# Patient Record
Sex: Female | Born: 1945 | Race: White | Hispanic: No | Marital: Married | Smoking: Current every day smoker
Health system: Southern US, Community
[De-identification: ages and names within clinical notes are randomized; demographics above are authoritative.]

## PROBLEM LIST (undated history)

## (undated) DIAGNOSIS — C801 Malignant (primary) neoplasm, unspecified: Secondary | ICD-10-CM

## (undated) DIAGNOSIS — H3530 Unspecified macular degeneration: Secondary | ICD-10-CM

## (undated) DIAGNOSIS — K219 Gastro-esophageal reflux disease without esophagitis: Secondary | ICD-10-CM

## (undated) DIAGNOSIS — E785 Hyperlipidemia, unspecified: Secondary | ICD-10-CM

## (undated) DIAGNOSIS — T7840XA Allergy, unspecified, initial encounter: Secondary | ICD-10-CM

## (undated) DIAGNOSIS — N8110 Cystocele, unspecified: Secondary | ICD-10-CM

## (undated) DIAGNOSIS — H269 Unspecified cataract: Secondary | ICD-10-CM

## (undated) DIAGNOSIS — K635 Polyp of colon: Secondary | ICD-10-CM

## (undated) DIAGNOSIS — E079 Disorder of thyroid, unspecified: Secondary | ICD-10-CM

## (undated) DIAGNOSIS — K5790 Diverticulosis of intestine, part unspecified, without perforation or abscess without bleeding: Secondary | ICD-10-CM

## (undated) HISTORY — DX: Unspecified macular degeneration: H35.30

## (undated) HISTORY — DX: Unspecified cataract: H26.9

## (undated) HISTORY — DX: Polyp of colon: K63.5

## (undated) HISTORY — PX: OOPHORECTOMY: SHX86

## (undated) HISTORY — PX: POLYPECTOMY: SHX149

## (undated) HISTORY — DX: Disorder of thyroid, unspecified: E07.9

## (undated) HISTORY — DX: Allergy, unspecified, initial encounter: T78.40XA

## (undated) HISTORY — DX: Diverticulosis of intestine, part unspecified, without perforation or abscess without bleeding: K57.90

## (undated) HISTORY — DX: Malignant (primary) neoplasm, unspecified: C80.1

## (undated) HISTORY — PX: COLONOSCOPY: SHX174

## (undated) HISTORY — DX: Cystocele, unspecified: N81.10

## (undated) HISTORY — PX: TUBAL LIGATION: SHX77

## (undated) HISTORY — PX: MOUTH SURGERY: SHX715

## (undated) HISTORY — DX: Hyperlipidemia, unspecified: E78.5

## (undated) HISTORY — DX: Gastro-esophageal reflux disease without esophagitis: K21.9

---

## 1970-06-30 HISTORY — PX: TONSILLECTOMY: SUR1361

## 1975-07-01 HISTORY — PX: FOOT SURGERY: SHX648

## 1991-07-01 HISTORY — PX: ABDOMINAL HYSTERECTOMY: SHX81

## 1998-12-20 ENCOUNTER — Other Ambulatory Visit: Admission: RE | Admit: 1998-12-20 | Discharge: 1998-12-20 | Payer: Self-pay | Admitting: Family Medicine

## 2000-10-19 ENCOUNTER — Emergency Department (HOSPITAL_COMMUNITY): Admission: EM | Admit: 2000-10-19 | Discharge: 2000-10-19 | Payer: Self-pay

## 2001-07-13 ENCOUNTER — Other Ambulatory Visit: Admission: RE | Admit: 2001-07-13 | Discharge: 2001-07-13 | Payer: Self-pay | Admitting: Family Medicine

## 2005-04-22 ENCOUNTER — Other Ambulatory Visit: Admission: RE | Admit: 2005-04-22 | Discharge: 2005-04-22 | Payer: Self-pay | Admitting: Family Medicine

## 2007-08-04 ENCOUNTER — Ambulatory Visit: Payer: Self-pay | Admitting: Internal Medicine

## 2007-08-17 ENCOUNTER — Encounter: Payer: Self-pay | Admitting: Internal Medicine

## 2007-08-17 ENCOUNTER — Ambulatory Visit: Payer: Self-pay | Admitting: Internal Medicine

## 2007-08-17 LAB — CONVERTED CEMR LAB
BUN: 7 mg/dL (ref 6–23)
Creatinine, Ser: 0.6 mg/dL (ref 0.4–1.2)

## 2007-08-19 ENCOUNTER — Ambulatory Visit: Payer: Self-pay | Admitting: Cardiology

## 2007-09-17 ENCOUNTER — Ambulatory Visit: Payer: Self-pay | Admitting: Internal Medicine

## 2008-09-06 ENCOUNTER — Ambulatory Visit (HOSPITAL_COMMUNITY): Admission: RE | Admit: 2008-09-06 | Discharge: 2008-09-06 | Payer: Self-pay | Admitting: Family Medicine

## 2010-06-18 ENCOUNTER — Ambulatory Visit (HOSPITAL_COMMUNITY)
Admission: RE | Admit: 2010-06-18 | Discharge: 2010-06-18 | Payer: Self-pay | Source: Home / Self Care | Attending: Family Medicine | Admitting: Family Medicine

## 2010-11-12 NOTE — Assessment & Plan Note (Signed)
[facility]                         GASTROENTEROLOGY OFFICE NOTE   Rhonda Kelly                          MRN:          [id number]  DATE:09/17/2007                            DOB:          [birth year]    HISTORY:  Ms. Rhonda Kelly presents today for followup.  She is a 65 year old  who was evaluated on August 04, 2007 for change in bowel habits,  abdominal pain, and colonoscopy.  See that dictation for details.  Complete colonoscopy was performed on August 17, 2007.  She was found  to have multiple diminutive adenomatous colon polyps which were removed.  She was also noted to have left-sided diverticulosis.  Follow-up  colonoscopy in three years recommended.  She subsequently underwent CT  scan of the abdomen and pelvis.  There were no acute abnormalities.  She  was said to have an indeterminate 11 mm left adrenal nodule.  The  radiologist recommended follow-up CT scan in 12 months or an MRI.  The  patient presents today for followup.  Since her colonoscopy, she reports  that she has had absolutely no problems with pain or any complaints.  She feels that the reassurance from the exam was most helpful.  I  reviewed with her today in detail her colonoscopy as well as the CT scan  results.   Her current medications remain simvastatin, Zetia, fenofibrate, Nexium,  and baby aspirin.   PHYSICAL EXAMINATION:  Physical exam today finds a well-appearing in no  acute distress.  She is alert and oriented.  Blood pressure 110/76, heart rate 72, weight is 129.4 pounds.  ABDOMEN:  Soft without tenderness, mass, or hernia.   IMPRESSION:  1. Complaints of left lower quadrant discomfort and change of bowel      habits, now resolved.  She may have had some transient spasm      related to diverticular disease or possibly adhesions from prior      hysterectomy.  In any event, she is doing well.  2. Multiple adenomatous colon polyps.  Due for surveillance  colonoscopy in three years.  3. Diverticulosis.  4. Family history of colon cancer.  5. Incidental 11 mm left adrenal nodule.  I told the patient that it      probably would be best for her to have followup in 12 months, as      recommended by the radiologist.  She can discuss with her primary      care providers, Dr. Christell Constant and Birdena Jubilee.  They can arrange the      follow-up examination at the appropriate time.  Otherwise, she will      follow up with me in this office for her routine colonoscopy in      three years.  Of course, she can return sooner if needed for any      active GI issues.     [name]. Marina Goodell, MD  Electronically Signed   JNP/MedQ  DD: 09/17/2007  DT: 09/17/2007  Job #: [id number]   cc:   Ernestina Penna, M.D.  Birdena Jubilee, NP

## 2010-11-12 NOTE — Assessment & Plan Note (Signed)
[facility]                         GASTROENTEROLOGY OFFICE NOTE   SHLEY, DOLBY                          MRN:          [id number]  DATE:08/04/2007                            DOB:          [birth year]    REASON FOR CONSULTATION:  Change in bowel habits, abdominal pain, and  colonoscopy.   HISTORY:  This is a pleasant, 65 year old white female with a history of  dyslipidemia and reflux disease.  She is referred through the courtesy  of Paulita Cradle, nurse practitioner, and Dr. Rudi Heap regarding  the above-listed issues.  The patient reports to me a 6 to 8 week  history of a constant, nagging discomfort in the left lower quadrant.  She describes it as a knot.  The pain or discomfort is not severe  enough to inhibit her activities.  Symptoms have been more prominent in  the last few weeks.  She is able to sleep.  Symptoms are sometimes worse  a couple of hours after a big meal.  She has also noticed that her  stools have changed in caliber.  She describes them as flat or pencil  sized.  She denies nausea, vomiting, or rectal bleeding.  Recent  hemoglobin was normal at 14.4.  She also mentions that her mother at age  60 was diagnosed with colon cancer last spring.  Her discomfort does not  radiate.  She denies urinary changes.  Her only other complaint is  occasional gas.  She does take Nexium infrequently (every 3 or 4 days),  or as needed for indigestion.  This is particularly noticeable with  dietary indiscretion, no dysphagia.   PAST MEDICAL HISTORY:  1. Dyslipidemia.  2. Reflux disease.   PAST SURGICAL HISTORY:  1. Hysterectomy with bilateral salpingo-oophorectomy.  2. Tubal ligation.  3. Tonsillectomy.  4. Unspecified foot surgery remotely.   ALLERGIES:  No known drug allergies.   CURRENT MEDICATIONS:  1. Simvastatin 40 mg daily.  2. Zetia 10 mg daily.  3. Fenofibrate 160 mg daily.  4. Nexium 40 mg daily.   FAMILY HISTORY:   Mother and 2 maternal aunts with colon cancer in their  advanced years.  Father with prostate cancer.   SOCIAL HISTORY:  The patient is married with 1 daughter.  She lives with  her husband.  She attended college for 2 years.  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  She  smokes a half a pack of cigarettes per day and has done so for years.  She denies alcohol use.   REVIEW OF SYSTEMS:  Per diagnostic evaluation form.   PHYSICAL EXAMINATION:  A well-appearing female in no acute distress.  Her blood pressure is 128/72, her heart rate is 68, her weight is 128  pounds.  She is 5 feet 2 inches in height.  HEENT:  Sclerae are anicteric, conjunctivae are pink, oral mucosa is  intact, there is no adenopathy.  LUNGS:  Clear.  HEART:  Regular.  ABDOMEN:  Soft without tenderness, masses, or hernia.  EXTREMITIES:  Without edema.   IMPRESSION:  1. Vague, focal left lower quadrant discomfort of uncertain etiology.  2. Change in stool caliber of uncertain clinical significance.  3. Family history of colon cancer in 1st and multiple 2nd degree      relatives.   RECOMMENDATIONS:  1. Colonoscopy to evaluate change in bowel habits, lower abdominal      discomfort, and provide neoplasia screening.  The nature of the      procedure as well as the risks, benefits, and alternatives have      been reviewed.  She understood and agreed to proceed.  2. If colonoscopy unrevealing, then consider imaging such as CT to      further evaluate discomfort.  3. Ongoing general medical care with Dr. Christell Constant.     [name]. Marina Goodell, MD  Electronically Signed    JNP/MedQ  DD: 08/04/2007  DT: 08/05/2007  Job #: [id number]   cc:   Ernestina Penna, M.D.

## 2011-10-27 ENCOUNTER — Encounter: Payer: Self-pay | Admitting: Internal Medicine

## 2011-11-27 ENCOUNTER — Encounter: Payer: Self-pay | Admitting: Internal Medicine

## 2011-11-27 ENCOUNTER — Ambulatory Visit (AMBULATORY_SURGERY_CENTER): Payer: Federal, State, Local not specified - PPO | Admitting: *Deleted

## 2011-11-27 VITALS — Ht 61.5 in | Wt 126.1 lb

## 2011-11-27 DIAGNOSIS — Z1211 Encounter for screening for malignant neoplasm of colon: Secondary | ICD-10-CM

## 2011-11-27 MED ORDER — PEG-KCL-NACL-NASULF-NA ASC-C 100 G PO SOLR: ORAL | Status: DC

## 2011-12-10 ENCOUNTER — Ambulatory Visit (AMBULATORY_SURGERY_CENTER): Payer: Federal, State, Local not specified - PPO | Admitting: Internal Medicine

## 2011-12-10 ENCOUNTER — Encounter: Payer: Self-pay | Admitting: Internal Medicine

## 2011-12-10 VITALS — BP 135/75 | HR 65 | Temp 98.2°F | Resp 17 | Ht 61.0 in | Wt 126.0 lb

## 2011-12-10 DIAGNOSIS — Z860100 Personal history of colon polyps, unspecified: Secondary | ICD-10-CM

## 2011-12-10 DIAGNOSIS — D126 Benign neoplasm of colon, unspecified: Secondary | ICD-10-CM

## 2011-12-10 DIAGNOSIS — Z1211 Encounter for screening for malignant neoplasm of colon: Secondary | ICD-10-CM

## 2011-12-10 MED ORDER — SODIUM CHLORIDE 0.9 % IV SOLN: 500.0000 mL | INTRAVENOUS | Status: DC

## 2011-12-10 NOTE — Op Note (Signed)
[facility] [address] [place], [place]  [address]  COLONOSCOPY PROCEDURE REPORT  PATIENT:  Rhonda, Kelly  MR#:  [id number] BIRTHDATE:  [birth year], 65 yrs. old  GENDER:  female ENDOSCOPIST:  December Hedtke. Eda Keys, MD REF. BY:  Surveillance Program Recall, PROCEDURE DATE:  12/10/2011 PROCEDURE:  Colonoscopy with snare polypectomy x 3 ASA CLASS:  Class II INDICATIONS:  history of pre-cancerous (adenomatous) colon polyps, family history of colon cancer, surveillance and high-risk screening ; index 08-2007 w/ multiple adenomas; parent (83) and 2 aunts MEDICATIONS:   MAC sedation, administered by CRNA, propofol (Diprivan) 300 mg IV  DESCRIPTION OF PROCEDURE:   After the risks benefits and alternatives of the procedure were thoroughly explained, informed consent was obtained.  Digital rectal exam was performed and revealed no abnormalities.   The LB CF-[id number] endoscope was introduced through the anus and advanced to the cecum, which was identified by both the appendix and ileocecal valve, without limitations.  The quality of the prep was excellent, using MoviPrep.  The instrument was then slowly withdrawn as the colon was fully examined. <<PROCEDUREIMAGES>>  FINDINGS:  Three polyps were found - 6mm in the cecum and 2mm, 7mm in the transverse colon. Polyps were snared without cautery. Retrieval was successful in larger two.  Moderate diverticulosis was found in the left colon.  Otherwise normal colonoscopy without other polyps, masses, vascular ectasias, or inflammatory changes. Retroflexed views in the rectum revealed no abnormalities.    The time to cecum =  3:21  minutes. The scope was then withdrawn in 11:21  minutes from the cecum and the procedure completed.  COMPLICATIONS:  None  ENDOSCOPIC IMPRESSION: 1) Three polyps - removed 2) Moderate diverticulosis in the left colon 3) Otherwise normal colonoscopy  RECOMMENDATIONS: 1) Repeat Colonoscopy in 3  years.  ______________________________ [name]. Eda Keys, MD  CC:  Paulita Cradle, NP;  The Patient  n. eSIGNED:   Merel Santoli N. Eda Keys at 12/10/2011 11:49 AM  Estill Bakes, [phone]

## 2011-12-10 NOTE — Patient Instructions (Addendum)
YOU HAD AN ENDOSCOPIC PROCEDURE TODAY AT THE [facility]: Refer to the procedure report that was given to you for any specific questions about what was found during the examination.  If the procedure report does not answer your questions, please call your gastroenterologist to clarify.  If you requested that your care partner not be given the details of your procedure findings, then the procedure report has been included in a sealed envelope for you to review at your convenience later.  YOU SHOULD EXPECT: Some feelings of bloating in the abdomen. Passage of more gas than usual.  Walking can help get rid of the air that was put into your GI tract during the procedure and reduce the bloating. If you had a lower endoscopy (such as a colonoscopy or flexible sigmoidoscopy) you may notice spotting of blood in your stool or on the toilet paper. If you underwent a bowel prep for your procedure, then you may not have a normal bowel movement for a few days.  DIET: Your first meal following the procedure should be a light meal and then it is ok to progress to your normal diet.  A half-sandwich or bowl of soup is an example of a good first meal.  Heavy or fried foods are harder to digest and may make you feel nauseous or bloated.  Likewise meals heavy in dairy and vegetables can cause extra gas to form and this can also increase the bloating.  Drink plenty of fluids but you should avoid alcoholic beverages for 24 hours.  ACTIVITY: Your care partner should take you home directly after the procedure.  You should plan to take it easy, moving slowly for the rest of the day.  You can resume normal activity the day after the procedure however you should NOT DRIVE or use heavy machinery for 24 hours (because of the sedation medicines used during the test).    SYMPTOMS TO REPORT IMMEDIATELY: A gastroenterologist can be reached at any hour.  During normal business hours, 8:30 AM to 5:00 PM Monday through Friday,  call [phone].  After hours and on weekends, please call the GI answering service at [phone] who will take a message and have the physician on call contact you.   Following lower endoscopy (colonoscopy or flexible sigmoidoscopy):  Excessive amounts of blood in the stool  Significant tenderness or worsening of abdominal pains  Swelling of the abdomen that is new, acute  Fever of 100F or higher  Following upper endoscopy (EGD)  Vomiting of blood or coffee ground material  New chest pain or pain under the shoulder blades  Painful or persistently difficult swallowing  New shortness of breath  Fever of 100F or higher  Black, tarry-looking stools  FOLLOW UP: If any biopsies were taken you will be contacted by phone or by letter within the next 1-3 weeks.  Call your gastroenterologist if you have not heard about the biopsies in 3 weeks.  Our staff will call the home number listed on your records the next business day following your procedure to check on you and address any questions or concerns that you may have at that time regarding the information given to you following your procedure. This is a courtesy call and so if there is no answer at the home number and we have not heard from you through the emergency physician on call, we will assume that you have returned to your regular daily activities without incident.  SIGNATURES/CONFIDENTIALITY: You and/or your care   partner have signed paperwork which will be entered into your electronic medical record.  These signatures attest to the fact that that the information above on your After Visit Summary has been reviewed and is understood.  Full responsibility of the confidentiality of this discharge information lies with you and/or your care-partner.  

## 2011-12-10 NOTE — Progress Notes (Signed)
Patient did not experience any of the following events: a burn prior to discharge; a fall within the facility; wrong site/side/patient/procedure/implant event; or a hospital transfer or hospital admission upon discharge from the facility. (G8907) Patient did not have preoperative order for IV antibiotic SSI prophylaxis. (G8918)  

## 2011-12-11 ENCOUNTER — Telehealth: Payer: Self-pay | Admitting: *Deleted

## 2011-12-11 NOTE — Telephone Encounter (Signed)
  Follow up Call-  Call back number 12/10/2011  Post procedure Call Back phone  # [phone]  Permission to leave phone message Yes     Patient questions:  Do you have a fever, pain , or abdominal swelling? no Pain Score  0 *  Have you tolerated food without any problems? yes  Have you been able to return to your normal activities? yes  Do you have any questions about your discharge instructions: Diet   no Medications  no Follow up visit  no  Do you have questions or concerns about your Care? no  Actions: * If pain score is 4 or above: No action needed, pain <4.

## 2011-12-16 ENCOUNTER — Encounter: Payer: Self-pay | Admitting: Internal Medicine

## 2012-12-07 ENCOUNTER — Ambulatory Visit: Payer: Self-pay | Admitting: Nurse Practitioner

## 2012-12-16 ENCOUNTER — Other Ambulatory Visit (INDEPENDENT_AMBULATORY_CARE_PROVIDER_SITE_OTHER): Payer: BC Managed Care – PPO

## 2012-12-16 DIAGNOSIS — E559 Vitamin D deficiency, unspecified: Secondary | ICD-10-CM

## 2012-12-16 DIAGNOSIS — I10 Essential (primary) hypertension: Secondary | ICD-10-CM

## 2012-12-16 DIAGNOSIS — E785 Hyperlipidemia, unspecified: Secondary | ICD-10-CM

## 2012-12-16 LAB — BASIC METABOLIC PANEL WITH GFR
BUN: 11 mg/dL (ref 6–23)
CO2: 25 mEq/L (ref 19–32)
Calcium: 10.1 mg/dL (ref 8.4–10.5)
Chloride: 108 mEq/L (ref 96–112)
Creat: 0.76 mg/dL (ref 0.50–1.10)
GFR, Est African American: >89 mL/min
GFR, Est Non African American: 83 mL/min
Glucose, Bld: 99 mg/dL (ref 70–99)
Potassium: 4 mEq/L (ref 3.5–5.3)
Sodium: 141 mEq/L (ref 135–145)

## 2012-12-16 LAB — HEPATIC FUNCTION PANEL
ALT: 22 U/L (ref 0–35)
AST: 25 U/L (ref 0–37)
Albumin: 4.1 g/dL (ref 3.5–5.2)
Alkaline Phosphatase: 55 U/L (ref 39–117)
Bilirubin, Direct: <0.1 mg/dL (ref 0.0–0.3)
Total Bilirubin: 0.3 mg/dL (ref 0.3–1.2)
Total Protein: 6.6 g/dL (ref 6.0–8.3)

## 2012-12-16 NOTE — Progress Notes (Signed)
Pt here today for labs only 

## 2012-12-17 LAB — NMR LIPOPROFILE WITH LIPIDS
Cholesterol, Total: 158 mg/dL (ref <200)
HDL Particle Number: 43.6 umol/L (ref >=30.5)
HDL Size: 8.5 nm — ABNORMAL LOW (ref >=9.2)
HDL-C: 55 mg/dL (ref >=40)
LDL (calc): 91 mg/dL (ref <100)
LDL Particle Number: 1410 nmol/L — ABNORMAL HIGH (ref <1000)
LDL Size: 20.7 nm (ref >20.5)
LP-IR Score: 67 — ABNORMAL HIGH (ref <=45)
Large HDL-P: 2.1 umol/L — ABNORMAL LOW (ref >=4.8)
Large VLDL-P: 1.9 nmol/L (ref <=2.7)
Small LDL Particle Number: 685 nmol/L — ABNORMAL HIGH (ref <=527)
Triglycerides: 61 mg/dL (ref <150)
VLDL Size: 47.3 nm — ABNORMAL HIGH (ref <=46.6)

## 2012-12-17 LAB — VITAMIN D 25 HYDROXY (VIT D DEFICIENCY, FRACTURES): Vit D, 25-Hydroxy: 74 ng/mL (ref 30–89)

## 2012-12-23 ENCOUNTER — Ambulatory Visit (INDEPENDENT_AMBULATORY_CARE_PROVIDER_SITE_OTHER): Payer: BC Managed Care – PPO | Admitting: Family Medicine

## 2012-12-23 ENCOUNTER — Encounter: Payer: Self-pay | Admitting: Family Medicine

## 2012-12-23 VITALS — BP 146/78 | HR 64 | Temp 97.6°F | Wt 124.0 lb

## 2012-12-23 DIAGNOSIS — K5730 Diverticulosis of large intestine without perforation or abscess without bleeding: Secondary | ICD-10-CM

## 2012-12-23 DIAGNOSIS — K219 Gastro-esophageal reflux disease without esophagitis: Secondary | ICD-10-CM | POA: Insufficient documentation

## 2012-12-23 DIAGNOSIS — M810 Age-related osteoporosis without current pathological fracture: Secondary | ICD-10-CM | POA: Insufficient documentation

## 2012-12-23 DIAGNOSIS — Z720 Tobacco use: Secondary | ICD-10-CM | POA: Insufficient documentation

## 2012-12-23 DIAGNOSIS — E785 Hyperlipidemia, unspecified: Secondary | ICD-10-CM

## 2012-12-23 DIAGNOSIS — D126 Benign neoplasm of colon, unspecified: Secondary | ICD-10-CM

## 2012-12-23 DIAGNOSIS — F17200 Nicotine dependence, unspecified, uncomplicated: Secondary | ICD-10-CM

## 2012-12-23 MED ORDER — FENOFIBRATE 160 MG PO TABS: 160.0000 mg | ORAL_TABLET | Freq: Every day | ORAL | Status: DC

## 2012-12-23 MED ORDER — EZETIMIBE 10 MG PO TABS: 10.0000 mg | ORAL_TABLET | Freq: Every day | ORAL | Status: DC

## 2012-12-23 MED ORDER — SIMVASTATIN 40 MG PO TABS: 40.0000 mg | ORAL_TABLET | Freq: Every day | ORAL | Status: DC

## 2012-12-23 MED ORDER — OMEPRAZOLE 40 MG PO CPDR: 40.0000 mg | DELAYED_RELEASE_CAPSULE | Freq: Every day | ORAL | Status: DC

## 2012-12-23 NOTE — Progress Notes (Signed)
  Subjective:    Patient ID: Rhonda Kelly, female    DOB: [birth year], 67 y.o.   MRN: [id number]  HPI This 67 y.o. female presents for evaluation of hyperlipidemia,GERD,osteoporosis, and tobacco abuse.  She is still struggling with trying to quit smoking.  She recently quit for 3 weeks out of the month and then returned to smoking again.  She has a rx for wellbutrin for smoking cessation and she is considering getting this filled and trying again.  She has GERD and takes omeprazole on occasion especially when she eats spaghetti.  She has been getting prolia injections every 6 months  She has had labs prior and her lipids look pretty good and so do the rest of her labs.  She has hx of vitaminD deficiency and her recent vitamin D was 72 and she is off any vitaminD supplements.  She has had recent colonoscopy and she did have polyps removed.  She has diverticulosis.   Review of Systems    No chest pain, SOB, HA, dizziness, vision change, N/V, diarrhea, constipation, dysuria, urinary urgency or frequency, myalgias, arthralgias or rash.  Objective:   Physical Exam  Vital signs noted  Well developed well nourished female.  HEENT - Head atraumatic Normocephalic                Eyes - PERRLA, Conjuctiva - clear Sclera- Clear EOMI                Ears - EAC's Wnl TM's Wnl Gross Hearing WNL                Nose - Nares patent                 Throat - oropharanx wnl Respiratory - Lungs CTA bilateral Cardiac - RRR S1 and S2 without murmur GI - Abdomen soft Nontender and bowel sounds active x 4 Extremities - No edema. Neuro - Grossly intact.      Assessment & Plan:  GERD (gastroesophageal reflux disease)- continue omeprazole  Other and unspecified hyperlipidemia Continue current regimen  Tobacco abuse Consider wellbutrin and keep trying to quit smoking  Osteoporosis, unspecified - Follow up with Prolia injections every 6 months.  Follow up in 6 months and will do labs same day as visit.

## 2012-12-23 NOTE — Patient Instructions (Signed)
Hypertriglyceridemia  Diet for High blood levels of Triglycerides Most fats in food are triglycerides. Triglycerides in your blood are stored as fat in your body. High levels of triglycerides in your blood may put you at a greater risk for heart disease and stroke.  Normal triglyceride levels are less than 150 mg/dL. Borderline high levels are 150-199 mg/dl. High levels are 200 - 499 mg/dL, and very high triglyceride levels are greater than 500 mg/dL. The decision to treat high triglycerides is generally based on the level. For people with borderline or high triglyceride levels, treatment includes weight loss and exercise. Drugs are recommended for people with very high triglyceride levels. Many people who need treatment for high triglyceride levels have metabolic syndrome. This syndrome is a collection of disorders that often include: insulin resistance, high blood pressure, blood clotting problems, high cholesterol and triglycerides. TESTING PROCEDURE FOR TRIGLYCERIDES  You should not eat 4 hours before getting your triglycerides measured. The normal range of triglycerides is between 10 and 250 milligrams per deciliter (mg/dl). Some people may have extreme levels (1000 or above), but your triglyceride level may be too high if it is above 150 mg/dl, depending on what other risk factors you have for heart disease.  People with high blood triglycerides may also have high blood cholesterol levels. If you have high blood cholesterol as well as high blood triglycerides, your risk for heart disease is probably greater than if you only had high triglycerides. High blood cholesterol is one of the main risk factors for heart disease. CHANGING YOUR DIET  Your weight can affect your blood triglyceride level. If you are more than 20% above your ideal body weight, you may be able to lower your blood triglycerides by losing weight. Eating less and exercising regularly is the best way to combat this. Fat provides more  calories than any other food. The best way to lose weight is to eat less fat. Only 30% of your total calories should come from fat. Less than 7% of your diet should come from saturated fat. A diet low in fat and saturated fat is the same as a diet to decrease blood cholesterol. By eating a diet lower in fat, you may lose weight, lower your blood cholesterol, and lower your blood triglyceride level.  Eating a diet low in fat, especially saturated fat, may also help you lower your blood triglyceride level. Ask your dietitian to help you figure how much fat you can eat based on the number of calories your caregiver has prescribed for you.  Exercise, in addition to helping with weight loss may also help lower triglyceride levels.   Alcohol can increase blood triglycerides. You may need to stop drinking alcoholic beverages.  Too much carbohydrate in your diet may also increase your blood triglycerides. Some complex carbohydrates are necessary in your diet. These may include bread, rice, potatoes, other starchy vegetables and cereals.  Reduce "simple" carbohydrates. These may include pure sugars, candy, honey, and jelly without losing other nutrients. If you have the kind of high blood triglycerides that is affected by the amount of carbohydrates in your diet, you will need to eat less sugar and less high-sugar foods. Your caregiver can help you with this.  Adding 2-4 grams of fish oil (EPA+ DHA) may also help lower triglycerides. Speak with your caregiver before adding any supplements to your regimen. Following the Diet  Maintain your ideal weight. Your caregivers can help you with a diet. Generally, eating less food and getting more   exercise will help you lose weight. Joining a weight control group may also help. Ask your caregivers for a good weight control group in your area.  Eat low-fat foods instead of high-fat foods. This can help you lose weight too.  These foods are lower in fat. Eat MORE of these:    Dried beans, peas, and lentils.  Egg whites.  Low-fat cottage cheese.  Fish.  Lean cuts of meat, such as round, sirloin, rump, and flank (cut extra fat off meat you fix).  Whole grain breads, cereals and pasta.  Skim and nonfat dry milk.  Low-fat yogurt.  Poultry without the skin.  Cheese made with skim or part-skim milk, such as mozzarella, parmesan, farmers', ricotta, or pot cheese. These are higher fat foods. Eat LESS of these:   Whole milk and foods made from whole milk, such as American, blue, cheddar, monterey jack, and swiss cheese  High-fat meats, such as luncheon meats, sausages, knockwurst, bratwurst, hot dogs, ribs, corned beef, ground pork, and regular ground beef.  Fried foods. Limit saturated fats in your diet. Substituting unsaturated fat for saturated fat may decrease your blood triglyceride level. You will need to read package labels to know which products contain saturated fats.  These foods are high in saturated fat. Eat LESS of these:   Fried pork skins.  Whole milk.  Skin and fat from poultry.  Palm oil.  Butter.  Shortening.  Cream cheese.  Bacon.  Margarines and baked goods made from listed oils.  Vegetable shortenings.  Chitterlings.  Fat from meats.  Coconut oil.  Palm kernel oil.  Lard.  Cream.  Sour cream.  Fatback.  Coffee whiteners and non-dairy creamers made with these oils.  Cheese made from whole milk. Use unsaturated fats (both polyunsaturated and monounsaturated) moderately. Remember, even though unsaturated fats are better than saturated fats; you still want a diet low in total fat.  These foods are high in unsaturated fat:   Canola oil.  Sunflower oil.  Mayonnaise.  Almonds.  Peanuts.  Pine nuts.  Margarines made with these oils.  Safflower oil.  Olive oil.  Avocados.  Cashews.  Peanut butter.  Sunflower seeds.  Soybean oil.  Peanut  oil.  Olives.  Pecans.  Walnuts.  Pumpkin seeds. Avoid sugar and other high-sugar foods. This will decrease carbohydrates without decreasing other nutrients. Sugar in your food goes rapidly to your blood. When there is excess sugar in your blood, your liver may use it to make more triglycerides. Sugar also contains calories without other important nutrients.  Eat LESS of these:   Sugar, brown sugar, powdered sugar, jam, jelly, preserves, honey, syrup, molasses, pies, candy, cakes, cookies, frosting, pastries, colas, soft drinks, punches, fruit drinks, and regular gelatin.  Avoid alcohol. Alcohol, even more than sugar, may increase blood triglycerides. In addition, alcohol is high in calories and low in nutrients. Ask for sparkling water, or a diet soft drink instead of an alcoholic beverage. Suggestions for planning and preparing meals   Bake, broil, grill or roast meats instead of frying.  Remove fat from meats and skin from poultry before cooking.  Add spices, herbs, lemon juice or vinegar to vegetables instead of salt, rich sauces or gravies.  Use a non-stick skillet without fat or use no-stick sprays.  Cool and refrigerate stews and broth. Then remove the hardened fat floating on the surface before serving.  Refrigerate meat drippings and skim off fat to make low-fat gravies.  Serve more fish.  Use less butter,   margarine and other high-fat spreads on bread or vegetables.  Use skim or reconstituted non-fat dry milk for cooking.  Cook with low-fat cheeses.  Substitute low-fat yogurt or cottage cheese for all or part of the sour cream in recipes for sauces, dips or congealed salads.  Use half yogurt/half mayonnaise in salad recipes.  Substitute evaporated skim milk for cream. Evaporated skim milk or reconstituted non-fat dry milk can be whipped and substituted for whipped cream in certain recipes.  Choose fresh fruits for dessert instead of high-fat foods such as pies or  cakes. Fruits are naturally low in fat. When Dining Out   Order low-fat appetizers such as fruit or vegetable juice, pasta with vegetables or tomato sauce.  Select clear, rather than cream soups.  Ask that dressings and gravies be served on the side. Then use less of them.  Order foods that are baked, broiled, poached, steamed, stir-fried, or roasted.  Ask for margarine instead of butter, and use only a small amount.  Drink sparkling water, unsweetened tea or coffee, or diet soft drinks instead of alcohol or other sweet beverages. QUESTIONS AND ANSWERS ABOUT OTHER FATS IN THE BLOOD: SATURATED FAT, TRANS FAT, AND CHOLESTEROL What is trans fat? Trans fat is a type of fat that is formed when vegetable oil is hardened through a process called hydrogenation. This process helps makes foods more solid, gives them shape, and prolongs their shelf life. Trans fats are also called hydrogenated or partially hydrogenated oils.  What do saturated fat, trans fat, and cholesterol in foods have to do with heart disease? Saturated fat, trans fat, and cholesterol in the diet all raise the level of LDL "bad" cholesterol in the blood. The higher the LDL cholesterol, the greater the risk for coronary heart disease (CHD). Saturated fat and trans fat raise LDL similarly.  What foods contain saturated fat, trans fat, and cholesterol? High amounts of saturated fat are found in animal products, such as fatty cuts of meat, chicken skin, and full-fat dairy products like butter, whole milk, cream, and cheese, and in tropical vegetable oils such as palm, palm kernel, and coconut oil. Trans fat is found in some of the same foods as saturated fat, such as vegetable shortening, some margarines (especially hard or stick margarine), crackers, cookies, baked goods, fried foods, salad dressings, and other processed foods made with partially hydrogenated vegetable oils. Small amounts of trans fat also occur naturally in some animal  products, such as milk products, beef, and lamb. Foods high in cholesterol include liver, other organ meats, egg yolks, shrimp, and full-fat dairy products. How can I use the new food label to make heart-healthy food choices? Check the Nutrition Facts panel of the food label. Choose foods lower in saturated fat, trans fat, and cholesterol. For saturated fat and cholesterol, you can also use the Percent Daily Value (%DV): 5% DV or less is low, and 20% DV or more is high. (There is no %DV for trans fat.) Use the Nutrition Facts panel to choose foods low in saturated fat and cholesterol, and if the trans fat is not listed, read the ingredients and limit products that list shortening or hydrogenated or partially hydrogenated vegetable oil, which tend to be high in trans fat. POINTS TO REMEMBER:   Discuss your risk for heart disease with your caregivers, and take steps to reduce risk factors.  Change your diet. Choose foods that are low in saturated fat, trans fat, and cholesterol.  Add exercise to your daily routine if   it is not already being done. Participate in physical activity of moderate intensity, like brisk walking, for at least 30 minutes on most, and preferably all days of the week. No time? Break the 30 minutes into three, 10-minute segments during the day.  Stop smoking. If you do smoke, contact your caregiver to discuss ways in which they can help you quit.  Do not use street drugs.  Maintain a normal weight.  Maintain a healthy blood pressure.  Keep up with your blood work for checking the fats in your blood as directed by your caregiver. Document Released: 04/03/2004 Document Revised: 12/16/2011 Document Reviewed: 10/30/2008 [employer] Patient Information 2014 [employer].  

## 2012-12-30 ENCOUNTER — Telehealth: Payer: Self-pay | Admitting: Pharmacist

## 2012-12-30 DIAGNOSIS — M810 Age-related osteoporosis without current pathological fracture: Secondary | ICD-10-CM

## 2012-12-30 NOTE — Telephone Encounter (Signed)
Next prolia injection due around 01/19/2013.  Patient usually order through mail order.  Will need to order and call to set up appt for administration once she receives.

## 2013-01-06 MED ORDER — DENOSUMAB 60 MG/ML ~~LOC~~ SOLN: 60.0000 mg | SUBCUTANEOUS | Status: DC

## 2013-01-06 NOTE — Telephone Encounter (Signed)
Patient left VM that Rx needed to be called to [facility] for Prolia Phone number [phone] ext [phone] Rx called to [facility].  Patient instructed to call office once received to schedule appt for administration.  Next Prolia due around 01/19/2013

## 2013-01-13 ENCOUNTER — Ambulatory Visit (INDEPENDENT_AMBULATORY_CARE_PROVIDER_SITE_OTHER): Payer: BC Managed Care – PPO | Admitting: Pharmacist

## 2013-01-13 DIAGNOSIS — M810 Age-related osteoporosis without current pathological fracture: Secondary | ICD-10-CM

## 2013-01-13 MED ORDER — DENOSUMAB 60 MG/ML ~~LOC~~ SOLN
60.0000 mg | Freq: Once | SUBCUTANEOUS | Status: AC
  Administered 2013-01-13: 60 mg via SUBCUTANEOUS

## 2013-01-13 NOTE — Progress Notes (Signed)
Patient ID: ANABELL SWINT, female   DOB: May 28, 1946, 67 y.o.   MRN: [id number]   HPI: Patient with osteoporosis. She started Prolia 60mg  injections 05/2010.  She is due to have next injection today. Previous Prolia was administered 07/22/2012.  She orders Prolia through mail order. Currently getting adequate calcium and vitamin D from diet and supplementation                                                             Last Vitamin D Result:  74 (11/2012) Last GFR Result:  83 (11/2012)    Calcium Assessment Calcium Intake  # of servings/day  Calcium mg  Milk (8 oz) 1  x  300  = 300mg   Yogurt (4 oz) 0 x  200 = 0  Cheese (1 oz) 1 x  200 = 200mg   Other Calcium sources   250mg   Ca supplement 600mg  = 600mg    Estimated calcium intake per day 1350mg     DEXA Results Date of Test T-Score for AP Spine L1-L4 T-Score for Total Left Hip T-Score for Total Right Hip  04/21/2012 -1.4 -2.4 -2.1  03/27/2010 -1.9 -2.5 -2.2  03/30/2001 -1.3 -1.9 --         Assessment: Osteoporosis   Recommendations: 1.  Prolia injection today in office 60mg  or 1 milliliter SQ - repeat in 6 months 2.  continue calcium 1200mg  daily through supplementation or diet.  3.  recommend weight bearing exercise - 30 minutes at least 4 days  per week.   4.  Counseled and educated about fall risk and prevention.  Recheck DEXA:  due after 04/21/2014   Time spent counseling patient:  10 minutes

## 2013-06-27 ENCOUNTER — Encounter: Payer: Self-pay | Admitting: Family Medicine

## 2013-06-27 ENCOUNTER — Ambulatory Visit (INDEPENDENT_AMBULATORY_CARE_PROVIDER_SITE_OTHER): Payer: Medicare Other | Admitting: Family Medicine

## 2013-06-27 ENCOUNTER — Telehealth: Payer: Self-pay | Admitting: Pharmacist

## 2013-06-27 VITALS — BP 137/70 | HR 84 | Temp 97.5°F | Ht 61.0 in | Wt 116.0 lb

## 2013-06-27 DIAGNOSIS — M810 Age-related osteoporosis without current pathological fracture: Secondary | ICD-10-CM

## 2013-06-27 DIAGNOSIS — E785 Hyperlipidemia, unspecified: Secondary | ICD-10-CM

## 2013-06-27 DIAGNOSIS — J069 Acute upper respiratory infection, unspecified: Secondary | ICD-10-CM | POA: Diagnosis not present

## 2013-06-27 MED ORDER — AZITHROMYCIN 250 MG PO TABS: ORAL_TABLET | ORAL | Status: DC

## 2013-06-27 NOTE — Addendum Note (Signed)
Addended by: Prescott Gum on: 06/27/2013 10:27 AM   Modules accepted: Orders

## 2013-06-27 NOTE — Patient Instructions (Signed)
Hypertriglyceridemia  Diet for High blood levels of Triglycerides Most fats in food are triglycerides. Triglycerides in your blood are stored as fat in your body. High levels of triglycerides in your blood may put you at a greater risk for heart disease and stroke.  Normal triglyceride levels are less than 150 mg/dL. Borderline high levels are 150-199 mg/dl. High levels are 200 - 499 mg/dL, and very high triglyceride levels are greater than 500 mg/dL. The decision to treat high triglycerides is generally based on the level. For people with borderline or high triglyceride levels, treatment includes weight loss and exercise. Drugs are recommended for people with very high triglyceride levels. Many people who need treatment for high triglyceride levels have metabolic syndrome. This syndrome is a collection of disorders that often include: insulin resistance, high blood pressure, blood clotting problems, high cholesterol and triglycerides. TESTING PROCEDURE FOR TRIGLYCERIDES  You should not eat 4 hours before getting your triglycerides measured. The normal range of triglycerides is between 10 and 250 milligrams per deciliter (mg/dl). Some people may have extreme levels (1000 or above), but your triglyceride level may be too high if it is above 150 mg/dl, depending on what other risk factors you have for heart disease.  People with high blood triglycerides may also have high blood cholesterol levels. If you have high blood cholesterol as well as high blood triglycerides, your risk for heart disease is probably greater than if you only had high triglycerides. High blood cholesterol is one of the main risk factors for heart disease. CHANGING YOUR DIET  Your weight can affect your blood triglyceride level. If you are more than 20% above your ideal body weight, you may be able to lower your blood triglycerides by losing weight. Eating less and exercising regularly is the best way to combat this. Fat provides more  calories than any other food. The best way to lose weight is to eat less fat. Only 30% of your total calories should come from fat. Less than 7% of your diet should come from saturated fat. A diet low in fat and saturated fat is the same as a diet to decrease blood cholesterol. By eating a diet lower in fat, you may lose weight, lower your blood cholesterol, and lower your blood triglyceride level.  Eating a diet low in fat, especially saturated fat, may also help you lower your blood triglyceride level. Ask your dietitian to help you figure how much fat you can eat based on the number of calories your caregiver has prescribed for you.  Exercise, in addition to helping with weight loss may also help lower triglyceride levels.   Alcohol can increase blood triglycerides. You may need to stop drinking alcoholic beverages.  Too much carbohydrate in your diet may also increase your blood triglycerides. Some complex carbohydrates are necessary in your diet. These may include bread, rice, potatoes, other starchy vegetables and cereals.  Reduce "simple" carbohydrates. These may include pure sugars, candy, honey, and jelly without losing other nutrients. If you have the kind of high blood triglycerides that is affected by the amount of carbohydrates in your diet, you will need to eat less sugar and less high-sugar foods. Your caregiver can help you with this.  Adding 2-4 grams of fish oil (EPA+ DHA) may also help lower triglycerides. Speak with your caregiver before adding any supplements to your regimen. Following the Diet  Maintain your ideal weight. Your caregivers can help you with a diet. Generally, eating less food and getting more   exercise will help you lose weight. Joining a weight control group may also help. Ask your caregivers for a good weight control group in your area.  Eat low-fat foods instead of high-fat foods. This can help you lose weight too.  These foods are lower in fat. Eat MORE of these:    Dried beans, peas, and lentils.  Egg whites.  Low-fat cottage cheese.  Fish.  Lean cuts of meat, such as round, sirloin, rump, and flank (cut extra fat off meat you fix).  Whole grain breads, cereals and pasta.  Skim and nonfat dry milk.  Low-fat yogurt.  Poultry without the skin.  Cheese made with skim or part-skim milk, such as mozzarella, parmesan, farmers', ricotta, or pot cheese. These are higher fat foods. Eat LESS of these:   Whole milk and foods made from whole milk, such as American, blue, cheddar, monterey jack, and swiss cheese  High-fat meats, such as luncheon meats, sausages, knockwurst, bratwurst, hot dogs, ribs, corned beef, ground pork, and regular ground beef.  Fried foods. Limit saturated fats in your diet. Substituting unsaturated fat for saturated fat may decrease your blood triglyceride level. You will need to read package labels to know which products contain saturated fats.  These foods are high in saturated fat. Eat LESS of these:   Fried pork skins.  Whole milk.  Skin and fat from poultry.  Palm oil.  Butter.  Shortening.  Cream cheese.  Bacon.  Margarines and baked goods made from listed oils.  Vegetable shortenings.  Chitterlings.  Fat from meats.  Coconut oil.  Palm kernel oil.  Lard.  Cream.  Sour cream.  Fatback.  Coffee whiteners and non-dairy creamers made with these oils.  Cheese made from whole milk. Use unsaturated fats (both polyunsaturated and monounsaturated) moderately. Remember, even though unsaturated fats are better than saturated fats; you still want a diet low in total fat.  These foods are high in unsaturated fat:   Canola oil.  Sunflower oil.  Mayonnaise.  Almonds.  Peanuts.  Pine nuts.  Margarines made with these oils.  Safflower oil.  Olive oil.  Avocados.  Cashews.  Peanut butter.  Sunflower seeds.  Soybean oil.  Peanut  oil.  Olives.  Pecans.  Walnuts.  Pumpkin seeds. Avoid sugar and other high-sugar foods. This will decrease carbohydrates without decreasing other nutrients. Sugar in your food goes rapidly to your blood. When there is excess sugar in your blood, your liver may use it to make more triglycerides. Sugar also contains calories without other important nutrients.  Eat LESS of these:   Sugar, brown sugar, powdered sugar, jam, jelly, preserves, honey, syrup, molasses, pies, candy, cakes, cookies, frosting, pastries, colas, soft drinks, punches, fruit drinks, and regular gelatin.  Avoid alcohol. Alcohol, even more than sugar, may increase blood triglycerides. In addition, alcohol is high in calories and low in nutrients. Ask for sparkling water, or a diet soft drink instead of an alcoholic beverage. Suggestions for planning and preparing meals   Bake, broil, grill or roast meats instead of frying.  Remove fat from meats and skin from poultry before cooking.  Add spices, herbs, lemon juice or vinegar to vegetables instead of salt, rich sauces or gravies.  Use a non-stick skillet without fat or use no-stick sprays.  Cool and refrigerate stews and broth. Then remove the hardened fat floating on the surface before serving.  Refrigerate meat drippings and skim off fat to make low-fat gravies.  Serve more fish.  Use less butter,   margarine and other high-fat spreads on bread or vegetables.  Use skim or reconstituted non-fat dry milk for cooking.  Cook with low-fat cheeses.  Substitute low-fat yogurt or cottage cheese for all or part of the sour cream in recipes for sauces, dips or congealed salads.  Use half yogurt/half mayonnaise in salad recipes.  Substitute evaporated skim milk for cream. Evaporated skim milk or reconstituted non-fat dry milk can be whipped and substituted for whipped cream in certain recipes.  Choose fresh fruits for dessert instead of high-fat foods such as pies or  cakes. Fruits are naturally low in fat. When Dining Out   Order low-fat appetizers such as fruit or vegetable juice, pasta with vegetables or tomato sauce.  Select clear, rather than cream soups.  Ask that dressings and gravies be served on the side. Then use less of them.  Order foods that are baked, broiled, poached, steamed, stir-fried, or roasted.  Ask for margarine instead of butter, and use only a small amount.  Drink sparkling water, unsweetened tea or coffee, or diet soft drinks instead of alcohol or other sweet beverages. QUESTIONS AND ANSWERS ABOUT OTHER FATS IN THE BLOOD: SATURATED FAT, TRANS FAT, AND CHOLESTEROL What is trans fat? Trans fat is a type of fat that is formed when vegetable oil is hardened through a process called hydrogenation. This process helps makes foods more solid, gives them shape, and prolongs their shelf life. Trans fats are also called hydrogenated or partially hydrogenated oils.  What do saturated fat, trans fat, and cholesterol in foods have to do with heart disease? Saturated fat, trans fat, and cholesterol in the diet all raise the level of LDL "bad" cholesterol in the blood. The higher the LDL cholesterol, the greater the risk for coronary heart disease (CHD). Saturated fat and trans fat raise LDL similarly.  What foods contain saturated fat, trans fat, and cholesterol? High amounts of saturated fat are found in animal products, such as fatty cuts of meat, chicken skin, and full-fat dairy products like butter, whole milk, cream, and cheese, and in tropical vegetable oils such as palm, palm kernel, and coconut oil. Trans fat is found in some of the same foods as saturated fat, such as vegetable shortening, some margarines (especially hard or stick margarine), crackers, cookies, baked goods, fried foods, salad dressings, and other processed foods made with partially hydrogenated vegetable oils. Small amounts of trans fat also occur naturally in some animal  products, such as milk products, beef, and lamb. Foods high in cholesterol include liver, other organ meats, egg yolks, shrimp, and full-fat dairy products. How can I use the new food label to make heart-healthy food choices? Check the Nutrition Facts panel of the food label. Choose foods lower in saturated fat, trans fat, and cholesterol. For saturated fat and cholesterol, you can also use the Percent Daily Value (%DV): 5% DV or less is low, and 20% DV or more is high. (There is no %DV for trans fat.) Use the Nutrition Facts panel to choose foods low in saturated fat and cholesterol, and if the trans fat is not listed, read the ingredients and limit products that list shortening or hydrogenated or partially hydrogenated vegetable oil, which tend to be high in trans fat. POINTS TO REMEMBER:   Discuss your risk for heart disease with your caregivers, and take steps to reduce risk factors.  Change your diet. Choose foods that are low in saturated fat, trans fat, and cholesterol.  Add exercise to your daily routine if   it is not already being done. Participate in physical activity of moderate intensity, like brisk walking, for at least 30 minutes on most, and preferably all days of the week. No time? Break the 30 minutes into three, 10-minute segments during the day.  Stop smoking. If you do smoke, contact your caregiver to discuss ways in which they can help you quit.  Do not use street drugs.  Maintain a normal weight.  Maintain a healthy blood pressure.  Keep up with your blood work for checking the fats in your blood as directed by your caregiver. Document Released: 04/03/2004 Document Revised: 12/16/2011 Document Reviewed: 10/30/2008 [employer] Patient Information 2014 [employer].  

## 2013-06-27 NOTE — Progress Notes (Signed)
   Subjective:    Patient ID: Rhonda Kelly, female    DOB: 1945-12-30, 67 y.o.   MRN: [id number]  HPI This 67 y.o. female presents for evaluation of osteoporosis, hyperlipidemia, and GERD.  Her GERD Is controlled. She is here to get labs for her hyperlipidemia.  She has brought her prolia with her she Received 3 days ago in the mail.  She is getting prolia every 6 months.  She had her last prolia injection 5 months ago and is due for prolia injection 07/16/13.  She is seeing Elvin So PharmD for this. She is having uri sx's.   Review of Systems C/o uri sx's. No chest pain, SOB, HA, dizziness, vision change, N/V, diarrhea, constipation, dysuria, urinary urgency or frequency, myalgias, arthralgias or rash.     Objective:   Physical Exam  Vital signs noted  Well developed well nourished female.  HEENT - Head atraumatic Normocephalic                Eyes - PERRLA, Conjuctiva - clear Sclera- Clear EOMI                Ears - EAC's Wnl TM's Wnl Gross Hearing WNL                Nose - Nares patent                 Throat - oropharanx wnl Respiratory - Lungs CTA bilateral Cardiac - RRR S1 and S2 without murmur GI - Abdomen soft Nontender and bowel sounds active x 4 Extremities - No edema. Neuro - Grossly intact.      Assessment & Plan:  Other and unspecified hyperlipidemia - Plan: POCT CBC, CMP14+EGFR, Lipid panel  URI, acute - Plan: azithromycin (ZITHROMAX) 250 MG tablet Push po fluids, rest, tylenol and motrin otc prn as directed for fever, arthralgias, and myalgias.  Follow up prn if sx's continue or persist.  Osteoporosis - Continue with prolia and follow up with Tammy Eckerd Pharm D.  Deatra Canter FNP

## 2013-06-28 LAB — LIPID PANEL
Chol/HDL Ratio: 2.9 ratio units (ref 0.0–4.4)
Cholesterol, Total: 159 mg/dL (ref 100–199)
HDL: 54 mg/dL (ref >39)
LDL Calculated: 79 mg/dL (ref 0–99)
Triglycerides: 130 mg/dL (ref 0–149)
VLDL Cholesterol Cal: 26 mg/dL (ref 5–40)

## 2013-06-28 LAB — CMP14+EGFR
ALT: 25 IU/L (ref 0–32)
AST: 35 IU/L (ref 0–40)
Albumin/Globulin Ratio: 2 (ref 1.1–2.5)
Albumin: 4.3 g/dL (ref 3.6–4.8)
Alkaline Phosphatase: 52 IU/L (ref 39–117)
BUN/Creatinine Ratio: 19 (ref 11–26)
BUN: 12 mg/dL (ref 8–27)
CO2: 18 mmol/L (ref 18–29)
Calcium: 9.2 mg/dL (ref 8.6–10.2)
Chloride: 105 mmol/L (ref 97–108)
Creatinine, Ser: 0.62 mg/dL (ref 0.57–1.00)
GFR calc Af Amer: 109 mL/min/{1.73_m2} (ref >59)
GFR calc non Af Amer: 95 mL/min/{1.73_m2} (ref >59)
Globulin, Total: 2.1 g/dL (ref 1.5–4.5)
Glucose: 87 mg/dL (ref 65–99)
Potassium: 3.9 mmol/L (ref 3.5–5.2)
Sodium: 143 mmol/L (ref 134–144)
Total Bilirubin: <0.2 mg/dL (ref 0.0–1.2)
Total Protein: 6.4 g/dL (ref 6.0–8.5)

## 2013-06-28 LAB — CBC WITH DIFFERENTIAL
Basophils Absolute: 0 10*3/uL (ref 0.0–0.2)
Basos: 1 %
Eos: 1 %
Eosinophils Absolute: 0 10*3/uL (ref 0.0–0.4)
HCT: 39.9 % (ref 34.0–46.6)
Hemoglobin: 13.5 g/dL (ref 11.1–15.9)
Immature Grans (Abs): 0 10*3/uL (ref 0.0–0.1)
Immature Granulocytes: 0 %
Lymphocytes Absolute: 1.3 10*3/uL (ref 0.7–3.1)
Lymphs: 24 %
MCH: 29.4 pg (ref 26.6–33.0)
MCHC: 33.8 g/dL (ref 31.5–35.7)
MCV: 87 fL (ref 79–97)
Monocytes Absolute: 0.7 10*3/uL (ref 0.1–0.9)
Monocytes: 13 %
Neutrophils Absolute: 3.1 10*3/uL (ref 1.4–7.0)
Neutrophils Relative %: 61 %
Platelets: 241 10*3/uL (ref 150–379)
RBC: 4.59 x10E6/uL (ref 3.77–5.28)
RDW: 14.3 % (ref 12.3–15.4)
WBC: 5.1 10*3/uL (ref 3.4–10.8)

## 2013-06-28 NOTE — Telephone Encounter (Signed)
Next prolia is due 07/16/13 but must wait until 06/30/13 for insurance verification.

## 2013-07-04 NOTE — Telephone Encounter (Signed)
Patient has already received prolia from mail order pharmacy. Appt for injection 07/18/13 at 11:30am patient aware.

## 2013-07-18 ENCOUNTER — Encounter: Payer: Self-pay | Admitting: Pharmacist

## 2013-07-18 ENCOUNTER — Ambulatory Visit (INDEPENDENT_AMBULATORY_CARE_PROVIDER_SITE_OTHER): Payer: Medicare Other | Admitting: Pharmacist

## 2013-07-18 VITALS — Ht 61.0 in | Wt 118.0 lb

## 2013-07-18 DIAGNOSIS — M810 Age-related osteoporosis without current pathological fracture: Secondary | ICD-10-CM

## 2013-07-18 MED ORDER — DENOSUMAB 60 MG/ML ~~LOC~~ SOLN
60.0000 mg | Freq: Once | SUBCUTANEOUS | Status: AC
  Administered 2013-07-18: 60 mg via SUBCUTANEOUS

## 2013-07-18 NOTE — Patient Instructions (Signed)
Prolia injection given today - next due around 01/15/2014.  Next DEXA (Bone Density) due around 03/2014.

## 2013-07-18 NOTE — Progress Notes (Signed)
Patient ID: Rhonda Kelly, female   DOB: [birth year], 68 y.o.   MRN: [id number] [facility]   HPI: Patient with osteoporosis. She started Prolia 60mg  injections 05/2010.  She is due to have next injection today. Previous Prolia was administered 12/2012.  She orders Prolia through mail order. Currently getting adequate calcium and vitamin D from diet and supplementation                                                             Last Vitamin D Result:  74 (11/2012) Last GFR Result:  83 (11/2012)    Calcium Assessment Calcium Intake  # of servings/day  Calcium mg  Milk (8 oz) 1  x  300  = 300mg   Yogurt (4 oz) 0 x  200 = 0  Cheese (1 oz) 1 x  200 = 200mg   Other Calcium sources   250mg   Ca supplement 600mg  = 600mg    Estimated calcium intake per day 1350mg     DEXA Results Date of Test T-Score for AP Spine L1-L4 T-Score for Total Left Hip T-Score for Total Right Hip  04/21/2012 -1.4 -2.4 -2.1  03/27/2010 -1.9 -2.5 -2.2  03/30/2001 -1.3 -1.9 --         Assessment: Osteoporosis   Recommendations: 1.  Prolia injection today in office 60mg  or 1 milliliter SQ - repeat in 6 months 2.  continue calcium 1200mg  daily through supplementation or diet.  3.  recommend weight bearing exercise - 30 minutes at least 4 days  per week.   4.  Counseled and educated about fall risk and prevention.  Recheck DEXA:  due after 04/21/2014   Time spent counseling patient:  10 minutes  Cherre Robins, PharmD, CPP

## 2013-10-20 ENCOUNTER — Other Ambulatory Visit: Payer: Self-pay | Admitting: Family Medicine

## 2013-12-15 ENCOUNTER — Telehealth: Payer: Self-pay | Admitting: Family Medicine

## 2013-12-15 MED ORDER — DENOSUMAB 60 MG/ML ~~LOC~~ SOLN: 60.0000 mg | SUBCUTANEOUS | Status: DC

## 2013-12-15 NOTE — Telephone Encounter (Signed)
rx sent - prolia due around 01/15/14. Appt made for Monday, July 20th for prolia administration.

## 2014-01-03 ENCOUNTER — Other Ambulatory Visit: Payer: Self-pay | Admitting: Family Medicine

## 2014-01-05 NOTE — Telephone Encounter (Signed)
Please print for pt to pickup RX. Thanks.

## 2014-01-16 ENCOUNTER — Encounter: Payer: Self-pay | Admitting: Pharmacist

## 2014-01-16 ENCOUNTER — Ambulatory Visit (INDEPENDENT_AMBULATORY_CARE_PROVIDER_SITE_OTHER): Payer: Medicare Other | Admitting: Pharmacist

## 2014-01-16 DIAGNOSIS — M810 Age-related osteoporosis without current pathological fracture: Secondary | ICD-10-CM | POA: Diagnosis not present

## 2014-01-16 MED ORDER — DENOSUMAB 60 MG/ML ~~LOC~~ SOLN
60.0000 mg | Freq: Once | SUBCUTANEOUS | Status: AC
Start: 2014-01-16 — End: 2014-01-16
  Administered 2014-01-16: 60 mg via SUBCUTANEOUS

## 2014-01-16 NOTE — Progress Notes (Signed)
Patient ID: Rhonda Kelly, female   DOB: [birth year], 68 y.o.   MR[facility]: [id number] [facility]   HPI: Patient with osteoporosis. She started Prolia 60mg  injections 05/2010.  She is due to have next injection today. Previous Prolia was administered 06/2013.  She orders Prolia through mail order. Currently getting adequate calcium and vitamin D from diet and supplementation                                                             Last Vitamin D Result:  74 (11/2012) Last GFR Result:  95 (05/2013)    Calcium Assessment Calcium Intake  # of servings/day  Calcium mg  Milk (8 oz) 1  x  300  = 300mg   Yogurt (4 oz) 0 x  200 = 0  Cheese (1 oz) 1 x  200 = 200mg   Other Calcium sources   250mg   Ca supplement 600mg  = 600mg    Estimated calcium intake per day 1350mg     DEXA Results Date of Test T-Score for AP Spine L1-L4 T-Score for Total Left Hip T-Score for Total Right Hip  04/21/2012 -1.4 -2.4 -2.1  03/27/2010 -1.9 -2.5 -2.2  03/30/2001 -1.3 -1.9 --         Assessment: Osteoporosis   Recommendations: 1.  Prolia injection today in office 60mg  or 1 milliliter SQ - repeat in 6 months 2.  continue calcium 1200mg  daily through supplementation or diet.  3.  recommend weight bearing exercise - 30 minutes at least 4 days  per week.   4.  Counseled and educated about fall risk and prevention.  Recheck DEXA:  due after 04/21/2014   Time spent counseling patient:  10 minutes  Cherre Robins, PharmD, CPP

## 2014-01-19 ENCOUNTER — Encounter: Payer: Self-pay | Admitting: Family Medicine

## 2014-01-19 ENCOUNTER — Ambulatory Visit (INDEPENDENT_AMBULATORY_CARE_PROVIDER_SITE_OTHER): Payer: Medicare Other | Admitting: Family Medicine

## 2014-01-19 VITALS — BP 127/66 | HR 69 | Temp 98.1°F | Ht 61.0 in | Wt 113.8 lb

## 2014-01-19 DIAGNOSIS — R5383 Other fatigue: Secondary | ICD-10-CM

## 2014-01-19 DIAGNOSIS — E785 Hyperlipidemia, unspecified: Secondary | ICD-10-CM | POA: Diagnosis not present

## 2014-01-19 DIAGNOSIS — R5381 Other malaise: Secondary | ICD-10-CM | POA: Diagnosis not present

## 2014-01-19 LAB — POCT CBC
Granulocyte percent: 53.5 %G (ref 37–80)
HCT, POC: 40 % (ref 37.7–47.9)
Hemoglobin: 12.9 g/dL (ref 12.2–16.2)
Lymph, poc: 2.9 (ref 0.6–3.4)
MCH, POC: 29.1 pg (ref 27–31.2)
MCHC: 32.3 g/dL (ref 31.8–35.4)
MCV: 90 fL (ref 80–97)
MPV: 9.9 fL (ref 0–99.8)
POC Granulocyte: 3.6 (ref 2–6.9)
POC LYMPH PERCENT: 42.9 %L (ref 10–50)
Platelet Count, POC: 214 10*3/uL (ref 142–424)
RBC: 4.4 M/uL (ref 4.04–5.48)
RDW, POC: 13.6 %
WBC: 6.8 10*3/uL (ref 4.6–10.2)

## 2014-01-19 MED ORDER — SIMVASTATIN 40 MG PO TABS: 40.0000 mg | ORAL_TABLET | Freq: Every day | ORAL | Status: DC

## 2014-01-19 MED ORDER — FENOFIBRATE 160 MG PO TABS: ORAL_TABLET | ORAL | Status: DC

## 2014-01-19 MED ORDER — EZETIMIBE 10 MG PO TABS: ORAL_TABLET | ORAL | Status: DC

## 2014-01-19 NOTE — Progress Notes (Signed)
   Subjective:    Patient ID: Rhonda Kelly, female    DOB: 03-17-1946, 68 y.o.   MRN: [id number]  HPI Patient is here for 6 month follow up.  She has hx of hyperlipidemia.  She has GERD which is controlled.  She is needing refills on her lipid medicine.  She needs labs.   Review of Systems No chest pain, SOB, HA, dizziness, vision change, N/V, diarrhea, constipation, dysuria, urinary urgency or frequency, myalgias, arthralgias or rash.     Objective:   Physical Exam Vital signs noted  Well developed well nourished female.  HEENT - Head atraumatic Normocephalic                Eyes - PERRLA, Conjuctiva - clear Sclera- Clear EOMI                Ears - EAC's Wnl TM's Wnl Gross Hearing WNL                Nose - Nares patent                 Throat - oropharanx wnl Respiratory - Lungs CTA bilateral Cardiac - RRR S1 and S2 without murmur GI - Abdomen soft Nontender and bowel sounds active x 4 Extremities - No edema. Neuro - Grossly intact.       Assessment & Plan:  Hyperlipemia - Plan: simvastatin (ZOCOR) 40 MG tablet, ezetimibe (ZETIA) 10 MG tablet, fenofibrate 160 MG tablet, POCT CBC, CMP14+EGFR, Lipid panel, Hepatic function panel, Thyroid Panel With TSH  Other fatigue - Plan: simvastatin (ZOCOR) 40 MG tablet, ezetimibe (ZETIA) 10 MG tablet, fenofibrate 160 MG tablet, POCT CBC, CMP14+EGFR, Lipid panel, Hepatic function panel, Thyroid Panel With TSH  Follow up in 6 months  Lysbeth Penner FNP

## 2014-01-20 ENCOUNTER — Telehealth: Payer: Self-pay | Admitting: Family Medicine

## 2014-01-20 LAB — CMP14+EGFR
ALT: 21 IU/L (ref 0–32)
AST: 34 IU/L (ref 0–40)
Albumin/Globulin Ratio: 1.7 (ref 1.1–2.5)
Albumin: 4 g/dL (ref 3.6–4.8)
Alkaline Phosphatase: 44 IU/L (ref 39–117)
BUN/Creatinine Ratio: 16 (ref 11–26)
BUN: 15 mg/dL (ref 8–27)
CO2: 24 mmol/L (ref 18–29)
Calcium: 11.2 mg/dL — ABNORMAL HIGH (ref 8.7–10.3)
Chloride: 104 mmol/L (ref 97–108)
Creatinine, Ser: 0.94 mg/dL (ref 0.57–1.00)
GFR calc Af Amer: 74 mL/min/{1.73_m2} (ref >59)
GFR calc non Af Amer: 64 mL/min/{1.73_m2} (ref >59)
Globulin, Total: 2.3 g/dL (ref 1.5–4.5)
Glucose: 83 mg/dL (ref 65–99)
Potassium: 3.9 mmol/L (ref 3.5–5.2)
Sodium: 143 mmol/L (ref 134–144)
Total Bilirubin: 0.3 mg/dL (ref 0.0–1.2)
Total Protein: 6.3 g/dL (ref 6.0–8.5)

## 2014-01-20 LAB — LIPID PANEL
Chol/HDL Ratio: 2.3 ratio units (ref 0.0–4.4)
Cholesterol, Total: 155 mg/dL (ref 100–199)
HDL: 66 mg/dL (ref >39)
LDL Calculated: 73 mg/dL (ref 0–99)
Triglycerides: 82 mg/dL (ref 0–149)
VLDL Cholesterol Cal: 16 mg/dL (ref 5–40)

## 2014-01-20 LAB — THYROID PANEL WITH TSH
Free Thyroxine Index: 2.4 (ref 1.2–4.9)
T3 Uptake Ratio: 25 % (ref 24–39)
T4, Total: 9.4 ug/dL (ref 4.5–12.0)
TSH: 3.51 u[IU]/mL (ref 0.450–4.500)

## 2014-01-20 LAB — HEPATIC FUNCTION PANEL: Bilirubin, Direct: 0.11 mg/dL (ref 0.00–0.40)

## 2014-01-20 NOTE — Telephone Encounter (Signed)
Message copied by Waverly Ferrari on Fri Jan 20, 2014  2:48 PM ------      Message from: Lysbeth Penner      Created: Fri Jan 20, 2014  1:39 PM       Labs normal ------

## 2014-02-21 DIAGNOSIS — Z1231 Encounter for screening mammogram for malignant neoplasm of breast: Secondary | ICD-10-CM | POA: Diagnosis not present

## 2014-05-03 ENCOUNTER — Encounter: Payer: Self-pay | Admitting: Pharmacist

## 2014-05-03 ENCOUNTER — Ambulatory Visit (INDEPENDENT_AMBULATORY_CARE_PROVIDER_SITE_OTHER): Payer: Medicare Other

## 2014-05-03 ENCOUNTER — Ambulatory Visit (INDEPENDENT_AMBULATORY_CARE_PROVIDER_SITE_OTHER): Payer: Medicare Other | Admitting: Pharmacist

## 2014-05-03 VITALS — Ht 61.0 in | Wt 118.0 lb

## 2014-05-03 DIAGNOSIS — M810 Age-related osteoporosis without current pathological fracture: Secondary | ICD-10-CM

## 2014-05-03 DIAGNOSIS — Z23 Encounter for immunization: Secondary | ICD-10-CM

## 2014-05-03 LAB — HM DEXA SCAN

## 2014-05-03 NOTE — Patient Instructions (Signed)

## 2014-05-03 NOTE — Progress Notes (Signed)
Patient ID: Rhonda Kelly, female   DOB: 08-09-1945, 68 y.o.   MRN: [id number] [facility] Current Height: Height: 5\' 1"  (154.9 cm)      Max Lifetime Height:  5' 1.75"  Current Weight: Weight: 118 lb (53.524 kg)       Ethnicity:Caucasian    HPI: Patient already has diagnosis of Osteoporosis  Back Pain?  No       Kyphosis?  No Prior fracture?  No Med(s) for Osteoporosis/Osteopenia:  prolia - started in 2011 Med(s) previously tried for Osteoporosis/Osteopenia:  none                                                             PMH: Age at menopause:  surgical Hysterectomy?  Yes Oophorectomy?  Yes HRT? Yes - Former.  Type/duration: premarin Steroid Use?  No Thyroid med?  No History of cancer?  No History of digestive disorders (ie Crohn's)?  Yes - GERD Current or previous eating disorders?  No Last Vitamin D Result:  74 (12/16/2012) Last GFR Result:  64 (01/19/2014)   FH/SH: Family history of osteoporosis?  No - possibly mother but not formally disgnosed Parent with history of hip fracture?  No Family history of breast cancer?  No Exercise?  No Smoking?  Yes Alcohol?  No    Calcium Assessment Calcium Intake  # of servings/day  Calcium mg  Milk (8 oz) 0  x  300  = 0  Yogurt (4 oz) 0.5 x  200 = 100mg   Cheese (1 oz) 0.5 x  200 = 100mg   Other Calcium sources   250mg   Ca supplement 600mg  twice a day = 1200mg    Estimated calcium intake per day 1650mg     DEXA Results Date of Test T-Score for AP Spine L1-L4 T-Score for Total Left Hip T-Score for Total Right Hip  05/03/2014 -1.4 -2.3 -2.2  04/21/2012 -1.4 -2.4 -2.1  03/27/2010 -1.9 -2.5 -2.2  03/30/2001 -1.3 -1.9 --   Assessment: Osteoporosis with improved BMD since starting Prolia  Recommendations: 1.  Continue Prolia 60mg  SQ every 6 months 2.  recommend calcium 1200mg  daily through supplementation or diet.  3.  recommend weight bearing exercise - 30 minutes at least 4 days per week.   4.  Counseled and educated  about fall risk and prevention. 5.   Influenza and Prevnar 13 vaccines given in office today\  Recheck DEXA:  2 years  Time spent counseling patient:  30 minutes   Cherre Robins, PharmD, CPP

## 2014-06-14 ENCOUNTER — Telehealth: Payer: Self-pay | Admitting: Family Medicine

## 2014-07-12 ENCOUNTER — Other Ambulatory Visit: Payer: Self-pay | Admitting: Pharmacist

## 2014-07-12 MED ORDER — DENOSUMAB 60 MG/ML ~~LOC~~ SOLN: 60.0000 mg | SUBCUTANEOUS | Status: DC

## 2014-08-07 ENCOUNTER — Ambulatory Visit (INDEPENDENT_AMBULATORY_CARE_PROVIDER_SITE_OTHER): Payer: Medicare Other | Admitting: Pharmacist

## 2014-08-07 DIAGNOSIS — M810 Age-related osteoporosis without current pathological fracture: Secondary | ICD-10-CM | POA: Diagnosis not present

## 2014-08-07 MED ORDER — DENOSUMAB 60 MG/ML ~~LOC~~ SOLN
60.0000 mg | Freq: Once | SUBCUTANEOUS | Status: AC
  Administered 2014-08-07: 60 mg via SUBCUTANEOUS

## 2014-08-07 NOTE — Progress Notes (Signed)
Patient ID: Rhonda Kelly, female   DOB: [birth year], 69 y.o.   MRN: [id number] [facility]   HPI: Patient has osteoporosis.  She has been receiveing Prolia injections q 6 months since 2011.  Due prolia injection today - last was 06/2014  Back Pain?  No       Kyphosis?  No Prior fracture?  No Med(s) for Osteoporosis/Osteopenia:  prolia - started in 2011 Med(s) previously tried for Osteoporosis/Osteopenia:  none                                                             PMH: Age at menopause:  surgical Hysterectomy?  Yes Oophorectomy?  Yes HRT? Yes - Former.  Type/duration: premarin Steroid Use?  No Thyroid med?  No History of cancer?  No History of digestive disorders (ie Crohn's)?  Yes - GERD Current or previous eating disorders?  No Last Vitamin D Result:  74 (12/16/2012) Last GFR Result:  64 (01/19/2014) Last Calcium was 11.2 (01/19/2014)   FH/SH: Family history of osteoporosis?  No - possibly mother but not formally disgnosed Parent with history of hip fracture?  No Family history of breast cancer?  No Exercise?  No Smoking?  Yes Alcohol?  No    Calcium Assessment Calcium Intake  # of servings/day  Calcium mg  Milk (8 oz) 0  x  300  = 0  Yogurt (4 oz) 0.5 x  200 = 100mg   Cheese (1 oz) 0.5 x  200 = 100mg   Other Calcium sources   250mg   Ca supplement 600mg  qd = 600mg    Estimated calcium intake per day 1650mg     DEXA Results Date of Test T-Score for AP Spine L1-L4 T-Score for Total Left Hip T-Score for Total Right Hip  05/03/2014 -1.4 -2.3 -2.2  04/21/2012 -1.4 -2.4 -2.1  03/27/2010 -1.9 -2.5 -2.2  03/30/2001 -1.3 -1.9 --   Assessment: Osteoporosis  Hypercalcium  Recommendations: 1.  Continue Prolia 60mg  SQ every 6 months - injection given in office today.  Patient received Prolia from mail order and supplied her own prolia. 2.  recommend calcium 1200mg  daily through supplementation or diet.  3.  recommend weight bearing exercise - 30 minutes at least 4  days per week.   4.  Checking BMET today  Recheck DEXA:  2017  Time spent counseling patient:  10 minutes   Cherre Robins, PharmD, CPP

## 2014-08-08 LAB — BMP8+EGFR
BUN / CREAT RATIO: 17 (ref 11–26)
BUN: 10 mg/dL (ref 8–27)
CHLORIDE: 108 mmol/L (ref 97–108)
CO2: 19 mmol/L (ref 18–29)
Calcium: 9.3 mg/dL (ref 8.7–10.3)
Creatinine, Ser: 0.6 mg/dL (ref 0.57–1.00)
GFR calc Af Amer: 110 mL/min/{1.73_m2} (ref >59)
GFR calc non Af Amer: 95 mL/min/{1.73_m2} (ref >59)
Glucose: 81 mg/dL (ref 65–99)
POTASSIUM: 4.2 mmol/L (ref 3.5–5.2)
SODIUM: 142 mmol/L (ref 134–144)

## 2014-08-09 ENCOUNTER — Encounter (INDEPENDENT_AMBULATORY_CARE_PROVIDER_SITE_OTHER): Payer: Medicare Other | Admitting: Ophthalmology

## 2014-11-30 ENCOUNTER — Encounter: Payer: Self-pay | Admitting: Internal Medicine

## 2015-01-11 ENCOUNTER — Encounter: Payer: Self-pay | Admitting: Internal Medicine

## 2015-02-05 ENCOUNTER — Ambulatory Visit (INDEPENDENT_AMBULATORY_CARE_PROVIDER_SITE_OTHER): Payer: Medicare Other | Admitting: Pharmacist

## 2015-02-05 ENCOUNTER — Encounter: Payer: Self-pay | Admitting: Pharmacist

## 2015-02-05 VITALS — BP 138/76 | HR 70 | Ht 61.0 in | Wt 116.0 lb

## 2015-02-05 DIAGNOSIS — Z0000 Encounter for general adult medical examination without abnormal findings: Secondary | ICD-10-CM | POA: Diagnosis not present

## 2015-02-05 DIAGNOSIS — E785 Hyperlipidemia, unspecified: Secondary | ICD-10-CM

## 2015-02-05 DIAGNOSIS — M810 Age-related osteoporosis without current pathological fracture: Secondary | ICD-10-CM

## 2015-02-05 DIAGNOSIS — Z8639 Personal history of other endocrine, nutritional and metabolic disease: Secondary | ICD-10-CM | POA: Diagnosis not present

## 2015-02-05 DIAGNOSIS — Z23 Encounter for immunization: Secondary | ICD-10-CM | POA: Diagnosis not present

## 2015-02-05 MED ORDER — DENOSUMAB 60 MG/ML ~~LOC~~ SOLN
60.0000 mg | Freq: Once | SUBCUTANEOUS | Status: AC
  Administered 2015-02-05: 60 mg via SUBCUTANEOUS

## 2015-02-05 NOTE — Progress Notes (Signed)
Patient ID: EDLA PARA, female   DOB: [birth year], 69 y.o.   MRN: [id number]    Subjective:   Rhonda Kelly is a 69 y.o. female who presents for an Initial Medicare Annual Wellness Visit to for injection of Prolia for ostoeporosis.   Patient has had Prolia for at least the last 2 years and has tolerated well.  She is due to recheck BMD 04/2016.  Patient reports that several years ago she has an elevated TSH and took levothyroxine for a while but has not needed in last 2 years or more.  She would like thyroid panel added to labs drawn today.   Current Medications (verified) Outpatient Encounter Prescriptions as of 02/05/2015  Medication Sig  . aspirin 81 MG tablet Take 81 mg by mouth daily.  Marland Kitchen b complex vitamins tablet Take 1 tablet by mouth daily.  . Calcium Carb-Cholecalciferol (CALCIUM + D3) 600-200 MG-UNIT TABS Take 1 tablet by mouth 2 (two) times daily before a meal.  . Co-Enzyme Q-10 100 MG CAPS Take 100 mg by mouth daily.  Marland Kitchen denosumab (PROLIA) 60 MG/ML SOLN injection Inject 60 mg into the skin every 6 (six) months. Administer in upper arm, thigh, or abdomen  . ezetimibe (ZETIA) 10 MG tablet TAKE ONE (1) TABLET EACH DAY  . fenofibrate 160 MG tablet TAKE ONE (1) TABLET EACH DAY  . KRILL OIL PO Take 1 capsule by mouth daily.  . Multiple Vitamins-Minerals (ICAPS AREDS FORMULA PO) Take 1 tablet by mouth daily.  Marland Kitchen omeprazole (PRILOSEC) 40 MG capsule Take 1 capsule (40 mg total) by mouth daily.  . simvastatin (ZOCOR) 40 MG tablet Take 1 tablet (40 mg total) by mouth at bedtime.  . [EXPIRED] denosumab (PROLIA) injection 60 mg    No facility-administered encounter medications on file as of 02/05/2015.    Allergies (verified) Review of patient's allergies indicates no known allergies.   History: Past Medical History  Diagnosis Date  . Hyperlipidemia   . GERD (gastroesophageal reflux disease)   . Osteoporosis   . Colon polyp   . Macular degeneration    Past Surgical History  Procedure  Laterality Date  . Tonsillectomy  1972  . Foot surgery  1977    right  . Abdominal hysterectomy Bilateral 1993  . Tubal ligation     Family History  Problem Relation Age of Onset  . Colon cancer Mother 86  . Heart attack Mother     during gall bladder surgery  . Heart disease Mother   . Colon cancer Maternal Grandmother 77  . Stomach cancer Neg Hx   . Cancer Father     prostate  . Diabetes Sister   . Diabetes Brother   . Heart attack Brother   . Heart attack Brother    Social History   Occupational History  . Not on file.   Social History Main Topics  . Smoking status: Current Every Day Smoker -- 0.25 packs/day    Types: Cigarettes  . Smokeless tobacco: Never Used  . Alcohol Use: Yes     Comment: rare - 3 times per year  . Drug Use: No  . Sexual Activity: Yes    Do you feel safe at home?  Yes  Dietary issues and exercise activities: Current Exercise Habits:: The patient has a physically strenous job, but has no regular exercise apart from work. 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  Tries to maintain healthy weight.   Objective:  Today's Vitals   02/05/15 1245  BP: 138/76  Pulse: 70  Height: 5' 1"  (1.549 m)  Weight: 116 lb (52.617 kg)  PainSc: 0-No pain   Body mass index is 21.93 kg/(m^2).  Activities of Daily Living In your present state of health, do you have any difficulty performing the following activities: 02/05/2015 02/05/2015  Hearing? N N  Vision? N -  Difficulty concentrating or making decisions? N -  Walking or climbing stairs? N -  Dressing or bathing? N -  Doing errands, shopping? N -  Preparing Food and eating ? N -  Using the Toilet? N -  In the past six months, have you accidently leaked urine? N -  Do you have problems with loss of bowel control? N -  Managing your Medications? N -  Managing your Finances? N -  Housekeeping or managing your Housekeeping? N -    Are there smokers in your home  (other than you)? No   Cardiac Risk Factors include: advanced age (>61mn, >>75women);dyslipidemia;family history of premature cardiovascular disease  Depression Screen PHQ 2/9 Scores 02/05/2015 05/03/2014 01/19/2014  PHQ - 2 Score 0 0 0    Fall Risk Fall Risk  02/05/2015 05/03/2014 01/19/2014  Falls in the past year? No No No    Cognitive Function: MMSE - Mini Mental State Exam 02/05/2015  Orientation to time 5  Orientation to Place 5  Registration 3  Attention/ Calculation 5  Recall 3  Language- name 2 objects 2  Language- repeat 1  Language- follow 3 step command 3  Language- read & follow direction 1  Write a sentence 1  Copy design 1  Total score 30    Immunizations and Health Maintenance Immunization History  Administered Date(s) Administered  . Influenza,inj,Quad PF,36+ Mos 05/03/2014  . Pneumococcal Conjugate-13 05/03/2014   Health Maintenance Due  Topic Date Due  . Hepatitis C Screening  009-05-1946 . TETANUS/TDAP  03/06/1967  . ZOSTAVAX  03/05/2008  . COLONOSCOPY  12/10/2014  . INFLUENZA VACCINE  01/29/2015    Patient Care Team: DChipper Herb MD as PCP - General (Family Medicine) YAdelina MingsHMargret Chance MD as Referring Physician (Optometry) JIrene Shipper MD as Consulting Physician (Gastroenterology)  Indicate any recent Medical Services you may have received from other than [facility] providers in the past year (date may be approximate).    Assessment:    Annual Wellness Visit  Osteoporosis Low BMI - underweight   Screening Tests Health Maintenance  Topic Date Due  . Hepatitis C Screening  [birth year] . TETANUS/TDAP  03/06/1967  . ZOSTAVAX  03/05/2008  . COLONOSCOPY  12/10/2014  . INFLUENZA VACCINE  01/29/2015  . PNA vac Low Risk Adult (2 of 2 - PPSV23) 05/04/2015  . MAMMOGRAM  02/22/2016  . DEXA SCAN  05/03/2016        Plan:   During the course of the visit JJadynnwas educated and counseled about the following appropriate screening and preventive services:    Vaccines to include Pneumoccal, Influenza, Hepatitis B, Td, Zostavax - received Zostavax in office today.  Checked Hep C antibody today  Colorectal cancer screening - Due to repeat colonoscopy q3 year - she is past due.  Reminded importance of colonoscopies since she has a family history of colon cancer.  She will make appt with Dr PHenrene Pastor Cardiovascular disease screen   Lipids - discussed with patient that if her lipid panel shows triglycerides   Diabetes screening - UTD  Bone  Denisty / Osteoporosis Screening - UTD, next due 04/2016  Mammogram - schedule in office today  Glaucoma screening /  Eye Exam - UTD  Nutrition counseling - discussed increasing calorie and nutrient dense food such as peanut butter, low fat yogurt, ensure  Smoking cessation counseling  Advanced Directives - UTD; copy requested  Orders Placed This Encounter  Procedures  . Varicella-zoster vaccine subcutaneous  . Lipid panel  . Thyroid Panel With TSH  . CMP14+EGFR  . Hepatitis C antibody  . CBC with Differential/Platelet      Patient Instructions (the written plan) were given to the patient.   Cherre Robins, [facility]   02/05/2015

## 2015-02-05 NOTE — Patient Instructions (Addendum)
Rhonda Kelly , Thank you for taking time to come for your Medicare Wellness Visit. I appreciate your ongoing commitment to your health goals. Please review the following plan we discussed and let me know if I can assist you in the future.   May bring in copy of [place] and Living Will to put on file if you like.    This is a list of the screening recommended for you and due dates:  Health Maintenance  Topic Date Due  .  Hepatitis C: One time screening is recommended by Center for Disease Control  (CDC) for  adults born from [birth year] through 1965.   Done today  . Tetanus Vaccine  03/06/1967  . Shingles Vaccine  Done today - completed.   . Colon Cancer Screening  12/10/2014  Due now - make appointment with Dr Henrene Pastor  . Flu Shot  01/29/2015  . Pneumonia vaccines Prevnar 13 up to date - due Pneumonia 75 in November 2016  05/04/2015  . Mammogram  02/22/2016 - appointment made for [place] unit at [facility] Monday, August 29th at 2pm  . DEXA scan (bone density measurement)  05/03/2016    Health Maintenance Adopting a healthy lifestyle and getting preventive care can go a long way to promote health and wellness. Talk with your health care provider about what schedule of regular examinations is right for you. This is a good chance for you to check in with your provider about disease prevention and staying healthy. In between checkups, there are plenty of things you can do on your own. Experts have done a lot of research about which lifestyle changes and preventive measures are most likely to keep you healthy. Ask your health care provider for more information. WEIGHT AND DIET  Eat a healthy diet  Be sure to include plenty of vegetables, fruits, low-fat dairy products, and lean protein.  Do not eat a lot of foods high in solid fats, added sugars, or salt.  Get regular exercise. This is one of the most important things you can do for your  health.  Most adults should exercise for at least 150 minutes each week. The exercise should increase your heart rate and make you sweat (moderate-intensity exercise).  Most adults should also do strengthening exercises at least twice a week. This is in addition to the moderate-intensity exercise.  Maintain a healthy weight  Body mass index (BMI) is a measurement that can be used to identify possible weight problems. It estimates body fat based on height and weight. Your health care provider can help determine your BMI and help you achieve or maintain a healthy weight.  For females 38 years of age and older:   A BMI below 18.5 is considered underweight.  A BMI of 18.5 to 24.9 is normal.  A BMI of 25 to 29.9 is considered overweight.  A BMI of 30 and above is considered obese.  Watch levels of cholesterol and blood lipids  You should start having your blood tested for lipids and cholesterol at 67 years of age, then have this test every 5 years.  You may need to have your cholesterol levels checked more often if:  Your lipid or cholesterol levels are high.  You are older than 67 years of age.  You are at high risk for heart disease.  CANCER SCREENING   Lung Cancer  Lung cancer screening is recommended for adults 76-80 years old who are at high risk  for lung cancer because of a history of smoking.  A yearly low-dose CT scan of the lungs is recommended for people who:  Currently smoke.  Have quit within the past 15 years.  Have at least a 30-pack-year history of smoking. A pack year is smoking an average of one pack of cigarettes a day for 1 year.  Yearly screening should continue until it has been 15 years since you quit.  Yearly screening should stop if you develop a health problem that would prevent you from having lung cancer treatment.  Breast Cancer  Practice breast self-awareness. This means understanding how your breasts normally appear and feel.  It also  means doing regular breast self-exams. Let your health care provider know about any changes, no matter how small.  If you are in your 20s or 30s, you should have a clinical breast exam (CBE) by a health care provider every 1-3 years as part of a regular health exam.  If you are 17 or older, have a CBE every year. Also consider having a breast X-ray (mammogram) every year.  If you have a family history of breast cancer, talk to your health care provider about genetic screening.  If you are at high risk for breast cancer, talk to your health care provider about having an MRI and a mammogram every year.  Breast cancer gene (BRCA) assessment is recommended for women who have family members with BRCA-related cancers. BRCA-related cancers include:  Breast.  Ovarian.  Tubal.  Peritoneal cancers.  Results of the assessment will determine the need for genetic counseling and BRCA1 and BRCA2 testing. Cervical Cancer Routine pelvic examinations to screen for cervical cancer are no longer recommended for nonpregnant women who are considered low risk for cancer of the pelvic organs (ovaries, uterus, and vagina) and who do not have symptoms. A pelvic examination may be necessary if you have symptoms including those associated with pelvic infections. Ask your health care provider if a screening pelvic exam is right for you.   The Pap test is the screening test for cervical cancer for women who are considered at risk.  If you had a hysterectomy for a problem that was not cancer or a condition that could lead to cancer, then you no longer need Pap tests.  If you are older than 65 years, and you have had normal Pap tests for the past 10 years, you no longer need to have Pap tests.  If you have had past treatment for cervical cancer or a condition that could lead to cancer, you need Pap tests and screening for cancer for at least 20 years after your treatment.  If you no longer get a Pap test, assess  your risk factors if they change (such as having a new sexual partner). This can affect whether you should start being screened again.  Some women have medical problems that increase their chance of getting cervical cancer. If this is the case for you, your health care provider may recommend more frequent screening and Pap tests.  The human papillomavirus (HPV) test is another test that may be used for cervical cancer screening. The HPV test looks for the virus that can cause cell changes in the cervix. The cells collected during the Pap test can be tested for HPV.  The HPV test can be used to screen women 79 years of age and older. Getting tested for HPV can extend the interval between normal Pap tests from three to five years.  An HPV  test also should be used to screen women of any age who have unclear Pap test results.  After 67 years of age, women should have HPV testing as often as Pap tests.  Colorectal Cancer  This type of cancer can be detected and often prevented.  Routine colorectal cancer screening usually begins at 67 years of age and continues through 67 years of age.  Your health care provider may recommend screening at an earlier age if you have risk factors for colon cancer.  Your health care provider may also recommend using home test kits to check for hidden blood in the stool.  A small camera at the end of a tube can be used to examine your colon directly (sigmoidoscopy or colonoscopy). This is done to check for the earliest forms of colorectal cancer.  Routine screening usually begins at age 68.  Direct examination of the colon should be repeated every 5-10 years through 67 years of age. However, you may need to be screened more often if early forms of precancerous polyps or small growths are found. Skin Cancer  Check your skin from head to toe regularly.  Tell your health care provider about any new moles or changes in moles, especially if there is a change in a  mole's shape or color.  Also tell your health care provider if you have a mole that is larger than the size of a pencil eraser.  Always use sunscreen. Apply sunscreen liberally and repeatedly throughout the day.  Protect yourself by wearing long sleeves, pants, a wide-brimmed hat, and sunglasses whenever you are outside. HEART DISEASE, DIABETES, AND HIGH BLOOD PRESSURE   Have your blood pressure checked at least every 1-2 years. High blood pressure causes heart disease and increases the risk of stroke.  If you are between 52 years and 68 years old, ask your health care provider if you should take aspirin to prevent strokes.  Have regular diabetes screenings. This involves taking a blood sample to check your fasting blood sugar level.  If you are at a normal weight and have a low risk for diabetes, have this test once every three years after 67 years of age.  If you are overweight and have a high risk for diabetes, consider being tested at a younger age or more often. PREVENTING INFECTION  Hepatitis B  If you have a higher risk for hepatitis B, you should be screened for this virus. You are considered at high risk for hepatitis B if:  You were born in a country where hepatitis B is common. Ask your health care provider which countries are considered high risk.  Your parents were born in a high-risk country, and you have not been immunized against hepatitis B (hepatitis B vaccine).  You have HIV or AIDS.  You use needles to inject street drugs.  You live with someone who has hepatitis B.  You have had sex with someone who has hepatitis B.  You get hemodialysis treatment.  You take certain medicines for conditions, including cancer, organ transplantation, and autoimmune conditions. Hepatitis C  Blood testing is recommended for:  Everyone born from [birth year] through 1963.  Anyone with known risk factors for hepatitis C. Sexually transmitted infections (STIs)  You should be  screened for sexually transmitted infections (STIs) including gonorrhea and chlamydia if:  You are sexually active and are younger than 67 years of age.  You are older than 67 years of age and your health care provider tells you that you are  at risk for this type of infection.  Your sexual activity has changed since you were last screened and you are at an increased risk for chlamydia or gonorrhea. Ask your health care provider if you are at risk.  If you do not have HIV, but are at risk, it may be recommended that you take a prescription medicine daily to prevent HIV infection. This is called pre-exposure prophylaxis (PrEP). You are considered at risk if:  You are sexually active and do not regularly use condoms or know the HIV status of your partner(s).  You take drugs by injection.  You are sexually active with a partner who has HIV. Talk with your health care provider about whether you are at high risk of being infected with HIV. If you choose to begin PrEP, you should first be tested for HIV. You should then be tested every 3 months for as long as you are taking PrEP.  PREGNANCY   If you are premenopausal and you may become pregnant, ask your health care provider about preconception counseling.  If you may become pregnant, take 400 to 800 micrograms (mcg) of folic acid every day.  If you want to prevent pregnancy, talk to your health care provider about birth control (contraception). OSTEOPOROSIS AND MENOPAUSE   Osteoporosis is a disease in which the bones lose minerals and strength with aging. This can result in serious bone fractures. Your risk for osteoporosis can be identified using a bone density scan.  If you are 15 years of age or older, or if you are at risk for osteoporosis and fractures, ask your health care provider if you should be screened.  Ask your health care provider whether you should take a calcium or vitamin D supplement to lower your risk for  osteoporosis.  Menopause may have certain physical symptoms and risks.  Hormone replacement therapy may reduce some of these symptoms and risks. Talk to your health care provider about whether hormone replacement therapy is right for you.  HOME CARE INSTRUCTIONS   Schedule regular health, dental, and eye exams.  Stay current with your immunizations.   Do not use any tobacco products including cigarettes, chewing tobacco, or electronic cigarettes.  If you are pregnant, do not drink alcohol.  If you are breastfeeding, limit how much and how often you drink alcohol.  Limit alcohol intake to no more than 1 drink per day for nonpregnant women. One drink equals 12 ounces of beer, 5 ounces of wine, or 1 ounces of hard liquor.  Do not use street drugs.  Do not share needles.  Ask your health care provider for help if you need support or information about quitting drugs.  Tell your health care provider if you often feel depressed.  Tell your health care provider if you have ever been abused or do not feel safe at home. Document Released: 12/30/2010 Document Revised: 10/31/2013 Document Reviewed: 05/18/2013 [facility] Patient Information 2015 [place], [place]. This information is not intended to replace advice given to you by your health care provider. Make sure you discuss any questions you have with your health care provider.

## 2015-02-06 LAB — LIPID PANEL
CHOLESTEROL TOTAL: 180 mg/dL (ref 100–199)
Chol/HDL Ratio: 2.5 ratio units (ref 0.0–4.4)
HDL: 71 mg/dL (ref >39)
LDL Calculated: 92 mg/dL (ref 0–99)
Triglycerides: 84 mg/dL (ref 0–149)
VLDL Cholesterol Cal: 17 mg/dL (ref 5–40)

## 2015-02-06 LAB — CBC WITH DIFFERENTIAL/PLATELET
BASOS: 1 %
Basophils Absolute: 0 10*3/uL (ref 0.0–0.2)
EOS (ABSOLUTE): 0.3 10*3/uL (ref 0.0–0.4)
EOS: 4 %
HEMOGLOBIN: 13.4 g/dL (ref 11.1–15.9)
Hematocrit: 41 % (ref 34.0–46.6)
IMMATURE GRANS (ABS): 0 10*3/uL (ref 0.0–0.1)
IMMATURE GRANULOCYTES: 0 %
Lymphocytes Absolute: 2.7 10*3/uL (ref 0.7–3.1)
Lymphs: 42 %
MCH: 29.9 pg (ref 26.6–33.0)
MCHC: 32.7 g/dL (ref 31.5–35.7)
MCV: 92 fL (ref 79–97)
Monocytes Absolute: 0.6 10*3/uL (ref 0.1–0.9)
Monocytes: 10 %
Neutrophils Absolute: 2.9 10*3/uL (ref 1.4–7.0)
Neutrophils: 43 %
PLATELETS: 264 10*3/uL (ref 150–379)
RBC: 4.48 x10E6/uL (ref 3.77–5.28)
RDW: 14 % (ref 12.3–15.4)
WBC: 6.6 10*3/uL (ref 3.4–10.8)

## 2015-02-06 LAB — CMP14+EGFR
ALBUMIN: 4.7 g/dL (ref 3.6–4.8)
ALT: 26 IU/L (ref 0–32)
AST: 33 IU/L (ref 0–40)
Albumin/Globulin Ratio: 2.1 (ref 1.1–2.5)
Alkaline Phosphatase: 51 IU/L (ref 39–117)
BUN/Creatinine Ratio: 14 (ref 11–26)
BUN: 11 mg/dL (ref 8–27)
Bilirubin Total: <0.2 mg/dL (ref 0.0–1.2)
CHLORIDE: 106 mmol/L (ref 97–108)
CO2: 25 mmol/L (ref 18–29)
CREATININE: 0.78 mg/dL (ref 0.57–1.00)
Calcium: 9.9 mg/dL (ref 8.7–10.3)
GFR calc non Af Amer: 79 mL/min/{1.73_m2} (ref >59)
GFR, EST AFRICAN AMERICAN: 92 mL/min/{1.73_m2} (ref >59)
GLUCOSE: 89 mg/dL (ref 65–99)
Globulin, Total: 2.2 g/dL (ref 1.5–4.5)
Potassium: 4.9 mmol/L (ref 3.5–5.2)
Sodium: 145 mmol/L — ABNORMAL HIGH (ref 134–144)
Total Protein: 6.9 g/dL (ref 6.0–8.5)

## 2015-02-06 LAB — THYROID PANEL WITH TSH
Free Thyroxine Index: 2.3 (ref 1.2–4.9)
T3 Uptake Ratio: 23 % — ABNORMAL LOW (ref 24–39)
T4, Total: 9.9 ug/dL (ref 4.5–12.0)
TSH: 3.4 u[IU]/mL (ref 0.450–4.500)

## 2015-02-06 LAB — HEPATITIS C ANTIBODY: Hep C Virus Ab: <0.1 s/co ratio (ref 0.0–0.9)

## 2015-02-08 ENCOUNTER — Ambulatory Visit (INDEPENDENT_AMBULATORY_CARE_PROVIDER_SITE_OTHER): Payer: Medicare Other | Admitting: Family Medicine

## 2015-02-08 ENCOUNTER — Encounter: Payer: Self-pay | Admitting: Family Medicine

## 2015-02-08 VITALS — BP 115/70 | HR 69 | Temp 97.0°F | Ht 61.0 in | Wt 116.0 lb

## 2015-02-08 DIAGNOSIS — K219 Gastro-esophageal reflux disease without esophagitis: Secondary | ICD-10-CM | POA: Diagnosis not present

## 2015-02-08 DIAGNOSIS — D126 Benign neoplasm of colon, unspecified: Secondary | ICD-10-CM | POA: Diagnosis not present

## 2015-02-08 DIAGNOSIS — R5383 Other fatigue: Secondary | ICD-10-CM

## 2015-02-08 DIAGNOSIS — E785 Hyperlipidemia, unspecified: Secondary | ICD-10-CM

## 2015-02-08 DIAGNOSIS — Z720 Tobacco use: Secondary | ICD-10-CM

## 2015-02-08 MED ORDER — EZETIMIBE-SIMVASTATIN 10-40 MG PO TABS: 1.0000 | ORAL_TABLET | Freq: Every day | ORAL | Status: DC

## 2015-02-08 NOTE — Assessment & Plan Note (Addendum)
Asymptomatic, not using PPI Continue PRN PPI, could change to H2 blocker

## 2015-02-08 NOTE — Assessment & Plan Note (Signed)
Every 3 year colonoscopies, she is due for another and states that she is about to get one scheduled

## 2015-02-08 NOTE — Assessment & Plan Note (Signed)
Stable, labs from 2 days ago very good She would like to reduce her medication, I will discontinue fenofibrate to start with Also decrease pill burden by combining zetia and simvastatin into Vytorin - I think she will have a similar co-pay Follow-up 3 months with repeat triglycerides

## 2015-02-08 NOTE — Patient Instructions (Signed)
Great to meet you!  Stopping smoking is the best thing he can do fear health, 1-800-quit-now  Come back in 3 months, try to get your fasting labs done about 1 week before that.

## 2015-02-08 NOTE — Assessment & Plan Note (Signed)
Counseled to stop smoking, she is not interested at this time Has tried Wellbutrin, did not like side effects Chantix helped once before, offered to repeat if she would like

## 2015-02-08 NOTE — Progress Notes (Signed)
Patient ID: Rhonda Kelly, female   DOB: May 16, 1946, 69 y.o.   MRN: [id number]   HPI  Patient presents today for follow-up of her chronic back problems and review of labs  Labs review shows well-controlled cholesterol, normal thyroid, slight hypernatremia which is inconsequential, normal liver and kidney function  Cholesterol Takes simvastatin, zetia, fenofibrate, and Prilosec. No side effects Watching her diet at home  Smoking Current one half pack per day smoker, not very interested in quitting at this time She states that she has quit before. She quit with the help of Chantix once before. She's tried Wellbutrin which made her feel funny and she did not like that effect  No chest pain, dyspnea, palpitations, leg edema, headache, bowel or bladder dysfunction, or heartburn  GERD No symptoms, does not use PPI routinely  PMH: Smoking status noted ROS: Per HPI  Objective: BP 115/70 mmHg  Pulse 69  Temp(Src) 97 F (36.1 C) (Oral)  Ht 5\' 1"  (1.549 m)  Wt 116 lb (52.617 kg)  BMI 21.93 kg/m2 Gen: NAD, alert, cooperative with exam HEENT: NCAT, oropharynx clear, nares clear, TMs WNL BL Neck: Supple CV: RRR, good S1/S2, no murmur Resp: CTABL, no wheezes, non-labored Abd: SNTND, BS present, no guarding or organomegaly Ext: No edema, warm Neuro: Alert and oriented, No gross deficits, 2+ patellar tendon reflexes  Assessment and plan:  Polyposis coli Every 3 year colonoscopies, she is due for another and states that she is about to get one scheduled  GERD (gastroesophageal reflux disease) Asymptomatic, not using PPI Continue PRN PPI, could change to H2 blocker   Tobacco abuse Counseled to stop smoking, she is not interested at this time Has tried Wellbutrin, did not like side effects Chantix helped once before, offered to repeat if she would like  HLD (hyperlipidemia) Stable, labs from 2 days ago very good She would like to reduce her medication, I will discontinue  fenofibrate to start with Also decrease pill burden by combining zetia and simvastatin into Vytorin - I think she will have a similar co-pay Follow-up 3 months with repeat triglycerides    Orders Placed This Encounter  Procedures  . Lipid Panel    Standing Status: Future     Number of Occurrences:      Standing Expiration Date: 02/08/2016    Meds ordered this encounter  Medications  . ezetimibe-simvastatin (VYTORIN) 10-40 MG per tablet    Sig: Take 1 tablet by mouth daily.    Dispense:  90 tablet    Refill:  3

## 2015-03-19 DIAGNOSIS — Z1231 Encounter for screening mammogram for malignant neoplasm of breast: Secondary | ICD-10-CM | POA: Diagnosis not present

## 2015-04-10 ENCOUNTER — Encounter: Payer: Self-pay | Admitting: Family Medicine

## 2015-04-10 ENCOUNTER — Ambulatory Visit (INDEPENDENT_AMBULATORY_CARE_PROVIDER_SITE_OTHER): Payer: Medicare Other | Admitting: Family Medicine

## 2015-04-10 VITALS — BP 143/72 | HR 65 | Temp 96.0°F | Ht 61.0 in | Wt 115.0 lb

## 2015-04-10 DIAGNOSIS — Z23 Encounter for immunization: Secondary | ICD-10-CM | POA: Diagnosis not present

## 2015-04-10 DIAGNOSIS — N812 Incomplete uterovaginal prolapse: Secondary | ICD-10-CM | POA: Diagnosis not present

## 2015-04-10 NOTE — Progress Notes (Signed)
   HPI  Patient presents today with concern for prolapsed bladder.  Patient explains that she noticed about 3-4 weeks ago what looks like a racquetball protruding from her vagina.  She states that her last 3 days she's noticed intermittent. She has recognized that whenever her bladder is full and is not there but that whenever her bladder is in to it is there. She denies any dysfunction with urinary incontinence, urinary frequency, frequent UTIs, or difficulty with sex. She is not sexually active.  She denies fevers, chills, sweats. She has had one baby vaginally She's has a history of hysterectomy  She has had one episode recently of what she feels was diverticulitis, she describes crampy persistent left lower quadrant pain for 12-13 days but was helped by ibuprofen. This is now completely resolved.  PMH: Smoking status noted ROS: Per HPI  Objective: BP 143/72 mmHg  Pulse 65  Temp(Src) 96 F (35.6 C) (Oral)  Ht 5\' 1"  (1.549 m)  Wt 115 lb (52.164 kg)  BMI 21.74 kg/m2 Gen: NAD, alert, cooperative with exam HEENT: NCAT\ CV: RRR, good S1/S2, no murmur Resp: CTABL, no wheezes, non-labored Ext: No edema, warm Neuro: Alert and oriented, No gross deficits  Assessment and plan:  # Cervical prolapse I offered her exam today which she declines due to no symptoms, but her history is consistent with a prolapsed cervix or vaginal cuff prolapse. She likely has a cystocele which is actually working to hold her cervix up whenever her bladder is full. She denies any symptoms or concerns about the prolapse, she would like to be referred to GYN.   #HCM Flu shot today  Orders Placed This Encounter  Procedures  . Ambulatory referral to Gynecology    Referral Priority:  Routine    Referral Type:  Consultation    Referral Reason:  Specialty Services Required    Requested Specialty:  Gynecology    Number of Visits Requested:  [place], MD [facility] 04/10/2015, 1:23 PM

## 2015-04-10 NOTE — Patient Instructions (Signed)
Great to see you again!  The vytorin was to combine 2 pills, zetia and simvastatin, so when you strt it, you will be taking two meds in 1 pill.   You should hear within a week about your appointment.

## 2015-05-03 ENCOUNTER — Ambulatory Visit (INDEPENDENT_AMBULATORY_CARE_PROVIDER_SITE_OTHER): Payer: Medicare Other | Admitting: Gynecology

## 2015-05-03 ENCOUNTER — Encounter: Payer: Self-pay | Admitting: Gynecology

## 2015-05-03 VITALS — BP 124/78 | Ht 61.0 in | Wt 117.0 lb

## 2015-05-03 DIAGNOSIS — N8111 Cystocele, midline: Secondary | ICD-10-CM

## 2015-05-03 DIAGNOSIS — R8279 Other abnormal findings on microbiological examination of urine: Secondary | ICD-10-CM | POA: Diagnosis not present

## 2015-05-03 DIAGNOSIS — N952 Postmenopausal atrophic vaginitis: Secondary | ICD-10-CM | POA: Diagnosis not present

## 2015-05-03 DIAGNOSIS — N816 Rectocele: Secondary | ICD-10-CM | POA: Diagnosis not present

## 2015-05-03 NOTE — Progress Notes (Signed)
MLISS WEDIN [birth year] [id number]        69 y.o.  G1P1 new patient seen in consultation from Dr. Kenn File in reference to new onset feeling vaginal pressure and being able to see something protruding from the vagina. Patient notes overall doing well up until approximately 1 month or so ago when she started noticing more vaginal pressure and she got a mirror and looked and saw something coming from the vagina. No urinary symptoms such as frequency, dysuria, urgency or incontinence.  No history of same before.  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 No history of gynecologic issues previously. No abnormal Pap smears. Actively being followed by her primary physician for routine health care.  Past medical history,surgical history, problem list, medications, allergies, family history and social history were all reviewed and documented as reviewed in the EPIC chart.  ROS:  Performed with pertinent positives and negatives included in the history, assessment and plan.   Additional significant findings :  none   Exam: Kim Counsellor Vitals:   05/03/15 1153  BP: 124/78  Height: 5\' 1"  (1.549 m)  Weight: 117 lb (53.071 kg)   General appearance:  Normal affect, orientation and appearance. Skin: Grossly normal HEENT: Without gross lesions.  No cervical or supraclavicular adenopathy. Thyroid normal.  Lungs:  Clear without wheezing, rales or rhonchi Cardiac: RR, without RMG Abdominal:  Soft, nontender, without masses, guarding, rebound, organomegaly or hernia Breasts:  Examined lying and sitting without masses, retractions, discharge or axillary adenopathy. Pelvic:  Ext/BUS/vagina with atrophic changes. Second-degree cystocele with bladder at the introitus with straining. Cuff well supported. Mild rectocele noted.  Adnexa  Without masses or tenderness    Anus and perineum  Normal   Rectovaginal  Normal sphincter tone without palpated masses or tenderness. Rectocele  confirmed.   Assessment/Plan:  69 y.o. G1P1 with cystocele the patient has noticed over the past month or so. Not having urinary symptoms. Not overly bothered by the situation. Options for management reviewed to include observation, pessary or surgery. The issues of surgery to include possible sling/mesh discussed. Also the issues of paradoxical continence with cystocele. If she does want to pursue surgery I recommended she follow up with urology for a more complete preoperative evaluation and surgery. At this point the patient is not interested in intervention, either pessary or surgery. She would prefer just to monitor as now she is reassured that nothing more significant is going on. Patient will follow up if her symptoms worsen or she wants to rediscuss treatment options. Otherwise she will continue to follow up with her primary physician for routine health care.  No history of abnormal Pap smears previously. I reviewed with her current screening recommendations and as she is over the age of 56 and status post hysterectomy for benign indications we both agree to stop screening.  Patient has a normal breast exam. Recent mammography 03/2015 normal. We'll continue with annual mammography. SBE melter reviewed.  Osteoporosis. Being followed by her primary physician for this on Prolia and she'll continue to follow up with them in reference to this.  Colonoscopy 2013. She'll repeat at their recommended interval.    Anastasio Auerbach MD, 12:27 PM 05/03/2015

## 2015-05-03 NOTE — Patient Instructions (Signed)
Follow up if your symptoms worsen or you want to rediscuss the options of pessary or surgery

## 2015-05-04 LAB — URINALYSIS W MICROSCOPIC + REFLEX CULTURE
Bacteria, UA: NONE SEEN [HPF]
Bilirubin Urine: NEGATIVE
CASTS: NONE SEEN [LPF]
Crystals: NONE SEEN [HPF]
Glucose, UA: NEGATIVE
Hgb urine dipstick: NEGATIVE
Ketones, ur: NEGATIVE
Leukocytes, UA: NEGATIVE
NITRITE: NEGATIVE
PH: 6 (ref 5.0–8.0)
Protein, ur: NEGATIVE
RBC / HPF: NONE SEEN RBC/HPF (ref <=2)
SPECIFIC GRAVITY, URINE: 1.004 (ref 1.001–1.035)
Squamous Epithelial / LPF: NONE SEEN [HPF] (ref <=5)
Yeast: NONE SEEN [HPF]

## 2015-05-05 LAB — URINE CULTURE
COLONY COUNT: NO GROWTH
ORGANISM ID, BACTERIA: NO GROWTH

## 2015-05-11 ENCOUNTER — Other Ambulatory Visit: Payer: Medicare Other

## 2015-05-15 ENCOUNTER — Ambulatory Visit: Payer: Medicare Other | Admitting: Family Medicine

## 2015-07-26 ENCOUNTER — Telehealth: Payer: Self-pay | Admitting: Family Medicine

## 2015-07-26 MED ORDER — DENOSUMAB 60 MG/ML ~~LOC~~ SOLN: 60.0000 mg | SUBCUTANEOUS | Status: DC

## 2015-07-26 NOTE — Telephone Encounter (Signed)
rx sent to [facility].  Will follow up with them in 1-2 days to make sure no problems and than med has been sent to patient.

## 2015-07-31 ENCOUNTER — Ambulatory Visit (INDEPENDENT_AMBULATORY_CARE_PROVIDER_SITE_OTHER): Payer: Medicare Other | Admitting: Family Medicine

## 2015-07-31 ENCOUNTER — Encounter: Payer: Self-pay | Admitting: Family Medicine

## 2015-07-31 VITALS — BP 119/67 | HR 70 | Temp 97.1°F | Ht 61.0 in | Wt 115.0 lb

## 2015-07-31 DIAGNOSIS — D126 Benign neoplasm of colon, unspecified: Secondary | ICD-10-CM | POA: Diagnosis not present

## 2015-07-31 DIAGNOSIS — N812 Incomplete uterovaginal prolapse: Secondary | ICD-10-CM

## 2015-07-31 DIAGNOSIS — J069 Acute upper respiratory infection, unspecified: Secondary | ICD-10-CM | POA: Diagnosis not present

## 2015-07-31 DIAGNOSIS — Z720 Tobacco use: Secondary | ICD-10-CM | POA: Diagnosis not present

## 2015-07-31 DIAGNOSIS — E785 Hyperlipidemia, unspecified: Secondary | ICD-10-CM

## 2015-07-31 DIAGNOSIS — D1391 Familial adenomatous polyposis: Secondary | ICD-10-CM

## 2015-07-31 DIAGNOSIS — Z23 Encounter for immunization: Secondary | ICD-10-CM

## 2015-07-31 MED ORDER — SIMVASTATIN 40 MG PO TABS: 40.0000 mg | ORAL_TABLET | Freq: Every day | ORAL | Status: DC

## 2015-07-31 NOTE — Patient Instructions (Signed)
Great to see you!  Lets see you back in 6 months, I think it would be fine to change to only simvastatin for 3 months and re-check labs afterwards. For now I would finish vytorin.   Consider your repeat colonoscopy  Let us know if you need [place]!

## 2015-07-31 NOTE — Progress Notes (Signed)
   HPI  Patient presents today for routine follow-up.  Prolapsed uterus She's doing well, only bothered by intermittently. She's considering surgical correction, no complaints at this time.  Polyposis coli She's overdue for her colonoscopy, she will call her GI doctor to schedule.  Osteoporosis She is on prolia, she has an appointment coming up with our pharmacist to arrange her next dose.  Cold She's had nasal congestion and mild cough for 5 days, she is a smoker. She denies any shortness of breath or chest pain. No fevers Tolerating foods and fluids easily.  Hyperlipidemia Doing well with Vytorin, however her insurance has stopped covering it. She would like to take a trial off of ezetimibe when she makes that change off the Vytorin, however she has plenty of medication to last her until June.   PMH: Smoking status noted ROS: Per HPI  Objective: BP 119/67 mmHg  Pulse 70  Temp(Src) 97.1 F (36.2 C) (Oral)  Ht 5\' 1"  (1.549 m)  Wt 115 lb (52.164 kg)  BMI 21.74 kg/m2 Gen: NAD, alert, cooperative with exam HEENT: NCAT CV: RRR, good S1/S2, no murmur Resp: CTABL, no wheezes, non-labored Ext: No edema, warm Neuro: Alert and oriented, No gross deficits  Assessment and plan:  # HLD Continue Vytorin until it is finished, she has about 5 months supply Change to simvastatin in June, repeat labs 3 months later Patient will follow-up in July. Repeat labs next fall unless needed for other reason  # Polyposis coli Encouraged colonoscopy, she will contact GI  # Upper respiratory infection Discussed supportive care Return if worsening or does not improve as expected, improving currently  # Prolapsed uterus Considering surgery, not bothered currently She will follow-up with GYN/urology as needed   Healthcare maintenance Pneumovax 23 given today, patient over 69, has had Prevnar over a year and a half ago   #Tobacco abuse Discussed, patient not ready to  quit  Orders Placed This Encounter  Procedures  . Pneumococcal polysaccharide vaccine 23-valent greater than or equal to 2yo subcutaneous/IM    Meds ordered this encounter  Medications  . simvastatin (ZOCOR) 40 MG tablet    Sig: Take 1 tablet (40 mg total) by mouth at bedtime.    Dispense:  90 tablet    Refill:  3    Hold for patients request, will not need it filled for a few months    Laroy Apple, MD [facility] 07/31/2015, 10:18 AM

## 2015-08-07 DIAGNOSIS — H2513 Age-related nuclear cataract, bilateral: Secondary | ICD-10-CM | POA: Diagnosis not present

## 2015-08-07 DIAGNOSIS — H40033 Anatomical narrow angle, bilateral: Secondary | ICD-10-CM | POA: Diagnosis not present

## 2015-08-08 ENCOUNTER — Ambulatory Visit (INDEPENDENT_AMBULATORY_CARE_PROVIDER_SITE_OTHER): Payer: Medicare Other | Admitting: Pharmacist

## 2015-08-08 DIAGNOSIS — M810 Age-related osteoporosis without current pathological fracture: Secondary | ICD-10-CM

## 2015-08-08 MED ORDER — DENOSUMAB 60 MG/ML ~~LOC~~ SOLN
60.0000 mg | Freq: Once | SUBCUTANEOUS | Status: AC
  Administered 2015-08-08: 60 mg via SUBCUTANEOUS

## 2015-08-08 NOTE — Progress Notes (Signed)
Patient ID: Rhonda Kelly, female   DOB: 1945/07/27, 70 y.o.   MRN: [id number] Patient ID: Rhonda Kelly, female   DOB: 04/05/1946, 70 y.o.   MRN: [id number] [facility]   HPI: Patient has osteoporosis.  She has been receiveing Prolia injections q 6 months since 2011.  Due prolia injection today - last was 01/2015  Back Pain?  No       Kyphosis?  No Prior fracture?  No Med(s) for Osteoporosis/Osteopenia:  prolia - started in 2011 Med(s) previously tried for Osteoporosis/Osteopenia:  none                                                             PMH: Age at menopause:  surgical Hysterectomy?  Yes Oophorectomy?  Yes HRT? Yes - Former.  Type/duration: premarin Steroid Use?  No Thyroid med?  No History of cancer?  No History of digestive disorders (ie Crohn's)?  Yes - GERD Current or previous eating disorders?  No Last Vitamin D Result:  74 (12/16/2012) Last GFR Result:  79 (02/05/2015) Last Calcium was 9.9 (02/05/2015) - has elevated calcium of 11.2 (01/19/2014)   FH/SH: Family history of osteoporosis?  No - possibly mother but not formally disgnosed Parent with history of hip fracture?  No Family history of breast cancer?  No Exercise?  No Smoking?  Yes Alcohol?  No    Calcium Assessment Calcium Intake  # of servings/day  Calcium mg  Milk (8 oz) 0  x  300  = 0  Yogurt (4 oz) 0.5 x  200 = 100mg   Cheese (1 oz) 0.5 x  200 = 100mg   Other Calcium sources   250mg   Ca supplement 600mg  qd = 600mg    Estimated calcium intake per day 1650mg     DEXA Results Date of Test T-Score for AP Spine L1-L4 T-Score for Total Left Hip T-Score for Total Right Hip  05/03/2014 -1.4 -2.3 -2.2  04/21/2012 -1.4 -2.4 -2.1  03/27/2010 -1.9 -2.5 -2.2  03/30/2001 -1.3 -1.9 --   Assessment: Osteoporosis  H/o Hypercalcium but has resolved  Recommendations: 1.  Continue Prolia 60mg  SQ every 6 months - injection given in office today.  Patient received Prolia from mail order and supplied her own  prolia. 2.  recommend calcium 1200mg  daily through supplementation or diet.  3.  recommend weight bearing exercise - 30 minutes at least 4 days per week.   4.  Checking BMET today  Recheck DEXA:  2017  Time spent counseling patient:  15 minutes   Cherre Robins, PharmD, CPP

## 2015-08-14 ENCOUNTER — Telehealth: Payer: Self-pay | Admitting: *Deleted

## 2015-08-14 ENCOUNTER — Telehealth: Payer: Self-pay

## 2015-08-14 NOTE — Telephone Encounter (Signed)
Patient said she has a prolapsed bladder and discussed options with you on 05/03/15 when she came in.  She said that you told her to consider and she has and she now feels that surgery is going to be the right thing for her.  She said you told her to call you and you would set her up with a surgeon to do this.

## 2015-08-14 NOTE — Telephone Encounter (Signed)
Schedule an appointment with urology. Dr Nicki Reaper Macdiarmid if available

## 2015-08-14 NOTE — Telephone Encounter (Signed)
-----   Message from Ramond Craver, [place] sent at 08/14/2015 12:55 PM EST ----- Regarding: referral to urology Per Dr. Milana Obey an appointment with urology. Dr Nicki Reaper Macdiarmid if available.  Patient with bladder prolapse and wants surgery.  Thanks!!!

## 2015-08-14 NOTE — Telephone Encounter (Signed)
Patient informed. I told her [facility] Malta will handle referral and will be in touch soon.

## 2015-08-14 NOTE — Telephone Encounter (Signed)
Notes faxed to [facility] they will fax me back with time and date to relay to patient.  

## 2015-08-20 NOTE — Telephone Encounter (Signed)
Appointment on 10/01/15 @ 1pm with Dr.MacDiamid pt informed.

## 2015-08-23 ENCOUNTER — Telehealth: Payer: Self-pay | Admitting: Pharmacist

## 2015-08-23 NOTE — Telephone Encounter (Signed)
Dr Satira Sark called to ask advised about stopping Prolia prior to dental implants.  There is a risk of osteonecrosis of the jaw.  He states implant surgery is not needed immediately.  I suggested she would skip next Prolia injectin which is due in August 2017 and plan to have implant procedure either October or November of 2017.  If no complications from implants then can resume Prolia 07/2016.

## 2015-09-24 ENCOUNTER — Ambulatory Visit (AMBULATORY_SURGERY_CENTER): Payer: Self-pay | Admitting: *Deleted

## 2015-09-24 VITALS — Ht 61.0 in | Wt 117.2 lb

## 2015-09-24 DIAGNOSIS — Z86010 Personal history of colonic polyps: Secondary | ICD-10-CM

## 2015-09-24 DIAGNOSIS — Z800 Family history of malignant neoplasm of digestive organs: Secondary | ICD-10-CM

## 2015-09-24 MED ORDER — NA SULFATE-K SULFATE-MG SULF 17.5-3.13-1.6 GM/177ML PO SOLN: 1.0000 | Freq: Once | ORAL | Status: DC

## 2015-09-24 NOTE — Progress Notes (Signed)
No egg or soy allergy known to patient  No issues with past sedation with any surgeries  or procedures, no intubation problems  No diet pills per patient No home 02 use per patient  No blood thinners per patient  Pt denies issues with constipation   

## 2015-10-01 DIAGNOSIS — R351 Nocturia: Secondary | ICD-10-CM | POA: Diagnosis not present

## 2015-10-01 DIAGNOSIS — R350 Frequency of micturition: Secondary | ICD-10-CM | POA: Diagnosis not present

## 2015-10-01 DIAGNOSIS — Z0000 Encounter for general adult medical examination without abnormal findings: Secondary | ICD-10-CM | POA: Diagnosis not present

## 2015-10-01 DIAGNOSIS — N8111 Cystocele, midline: Secondary | ICD-10-CM | POA: Diagnosis not present

## 2015-10-08 ENCOUNTER — Ambulatory Visit (AMBULATORY_SURGERY_CENTER): Payer: Medicare Other | Admitting: Internal Medicine

## 2015-10-08 ENCOUNTER — Encounter: Payer: Self-pay | Admitting: Internal Medicine

## 2015-10-08 VITALS — BP 126/59 | HR 60 | Temp 98.2°F | Resp 13

## 2015-10-08 DIAGNOSIS — Z800 Family history of malignant neoplasm of digestive organs: Secondary | ICD-10-CM | POA: Diagnosis not present

## 2015-10-08 DIAGNOSIS — D120 Benign neoplasm of cecum: Secondary | ICD-10-CM | POA: Diagnosis not present

## 2015-10-08 DIAGNOSIS — D122 Benign neoplasm of ascending colon: Secondary | ICD-10-CM | POA: Diagnosis not present

## 2015-10-08 DIAGNOSIS — D123 Benign neoplasm of transverse colon: Secondary | ICD-10-CM | POA: Diagnosis not present

## 2015-10-08 DIAGNOSIS — Z86010 Personal history of colonic polyps: Secondary | ICD-10-CM

## 2015-10-08 MED ORDER — SODIUM CHLORIDE 0.9 % IV SOLN: 500.0000 mL | INTRAVENOUS | Status: DC

## 2015-10-08 NOTE — Patient Instructions (Signed)
YOU HAD AN ENDOSCOPIC PROCEDURE TODAY AT THE [facility]:   Refer to the procedure report that was given to you for any specific questions about what was found during the examination.  If the procedure report does not answer your questions, please call your gastroenterologist to clarify.  If you requested that your care partner not be given the details of your procedure findings, then the procedure report has been included in a sealed envelope for you to review at your convenience later.  YOU SHOULD EXPECT: Some feelings of bloating in the abdomen. Passage of more gas than usual.  Walking can help get rid of the air that was put into your GI tract during the procedure and reduce the bloating. If you had a lower endoscopy (such as a colonoscopy or flexible sigmoidoscopy) you may notice spotting of blood in your stool or on the toilet paper. If you underwent a bowel prep for your procedure, you may not have a normal bowel movement for a few days.  Please Note:  You might notice some irritation and congestion in your nose or some drainage.  This is from the oxygen used during your procedure.  There is no need for concern and it should clear up in a day or so.  SYMPTOMS TO REPORT IMMEDIATELY:   Following lower endoscopy (colonoscopy or flexible sigmoidoscopy):  Excessive amounts of blood in the stool  Significant tenderness or worsening of abdominal pains  Swelling of the abdomen that is new, acute  Fever of 100F or higher    For urgent or emergent issues, a gastroenterologist can be reached at any hour by calling [phone].   DIET: Your first meal following the procedure should be a small meal and then it is ok to progress to your normal diet. Heavy or fried foods are harder to digest and may make you feel nauseous or bloated.  Likewise, meals heavy in dairy and vegetables can increase bloating.  Drink plenty of fluids but you should avoid alcoholic beverages for 24  hours.  ACTIVITY:  You should plan to take it easy for the rest of today and you should NOT DRIVE or use heavy machinery until tomorrow (because of the sedation medicines used during the test).    FOLLOW UP: Our staff will call the number listed on your records the next business day following your procedure to check on you and address any questions or concerns that you may have regarding the information given to you following your procedure. If we do not reach you, we will leave a message.  However, if you are feeling well and you are not experiencing any problems, there is no need to return our call.  We will assume that you have returned to your regular daily activities without incident.  If any biopsies were taken you will be contacted by phone or by letter within the next 1-3 weeks.  Please call us at [phone] if you have not heard about the biopsies in 3 weeks.    SIGNATURES/CONFIDENTIALITY: You and/or your care partner have signed paperwork which will be entered into your electronic medical record.  These signatures attest to the fact that that the information above on your After Visit Summary has been reviewed and is understood.  Full responsibility of the confidentiality of this discharge information lies with you and/or your care-partner.   Information on polyps,diverticulosis ,and high fiber diet given to you today 

## 2015-10-08 NOTE — Progress Notes (Signed)
Called to room to assist during endoscopic procedure.  Patient ID and intended procedure confirmed with present staff. Received instructions for my participation in the procedure from the performing physician.  

## 2015-10-08 NOTE — Op Note (Signed)
[place] Patient Name: Rhonda Kelly Procedure Date: 10/08/2015 10:28 AM MRN: [id number] Endoscopist: Docia Chuck. Henrene Pastor , MD Age: 70 Date of Birth: [birth year] Gender: Female Procedure:                Colonoscopy with cold snare polypectomy -3 Indications:              High risk colon cancer surveillance: Personal                            history of multiple (3 or more) adenomas. Previous                            examinations 2009 and 2013 with multiple                            adenomatous polyps. Family history of colon cancer                            in a parent (27) and 2 aunts Medicines:                Monitored Anesthesia Care Procedure:                Pre-Anesthesia Assessment:                           - Prior to the procedure, a History and Physical                            was performed, and patient medications and                            allergies were reviewed. The patient's tolerance of                            previous anesthesia was also reviewed. The risks                            and benefits of the procedure and the sedation                            options and risks were discussed with the patient.                            All questions were answered, and informed consent                            was obtained. Prior Anticoagulants: The patient has                            taken no previous anticoagulant or antiplatelet                            agents. ASA Grade Assessment: II - A patient with  mild systemic disease. After reviewing the risks                            and benefits, the patient was deemed in                            satisfactory condition to undergo the procedure.                           After obtaining informed consent, the colonoscope                            was passed under direct vision. Throughout the                            procedure, the patient's blood pressure, pulse, and                         oxygen saturations were monitored continuously. The                            Model CF-HQ190L [id number]) scope was introduced                            through the anus and advanced to the the cecum,                            identified by appendiceal orifice and ileocecal                            valve. The colonoscopy was performed without                            difficulty. The patient tolerated the procedure                            well. The quality of the bowel preparation was                            good. The bowel preparation used was SUPREP. The                            ileocecal valve, appendiceal orifice, and rectum                            were photographed. Scope In: 10:43:10 AM Scope Out: 10:59:13 AM Scope Withdrawal Time: 0 hours 12 minutes 47 seconds  Total Procedure Duration: 0 hours 16 minutes 3 seconds  Findings:                 The digital rectal exam was normal.                           Three polyps were found in the transverse colon,  ascending colon and cecum. The polyps were 2 to 4                            mm in size. These polyps were removed with a cold                            snare. Resection and retrieval were complete.                           Multiple diverticula were found in the left colon                            and right colon.                           The exam was otherwise without abnormality on                            direct and retroflexion views. Complications:            No immediate complications. Estimated Blood Loss:     Estimated blood loss: none. Impression:               - Three 2 to 4 mm polyps in the transverse colon,                            in the ascending colon and in the cecum, removed                            with a cold snare. Resected and retrieved.                           - Diverticulosis in the left colon and in the right                             colon.                           - The examination was otherwise normal on direct                            and retroflexion views. Recommendation:           - Patient has a contact number available for                            emergencies. The signs and symptoms of potential                            delayed complications were discussed with the                            patient. Return to normal activities tomorrow.  Written discharge instructions were provided to the                            patient.                           - Resume previous diet.                           - Continue present medications.                           - Await pathology results.                           - Repeat colonoscopy in 3 years for surveillance. Docia Chuck. Henrene Pastor, MD 10/08/2015 11:05:36 AM This report has been signed electronically. CC Letter to:             Mikeal Hawthorne L. Wendi Snipes

## 2015-10-08 NOTE — Progress Notes (Signed)
To recovery, report to [place], RN, VSS 

## 2015-10-09 ENCOUNTER — Telehealth: Payer: Self-pay | Admitting: *Deleted

## 2015-10-09 NOTE — Telephone Encounter (Signed)
  Follow up Call-  Call back number 10/08/2015  Post procedure Call Back phone  # [phone]  Permission to leave phone message Yes     Patient questions:  Do you have a fever, pain , or abdominal swelling? No. Pain Score  0 *  Have you tolerated food without any problems? Yes.    Have you been able to return to your normal activities? Yes.    Do you have any questions about your discharge instructions: Diet   No. Medications  No. Follow up visit  No.  Do you have questions or concerns about your Care? No.  Actions: * If pain score is 4 or above: No action needed, pain <4.

## 2015-10-16 ENCOUNTER — Encounter: Payer: Self-pay | Admitting: Internal Medicine

## 2016-01-18 ENCOUNTER — Encounter: Payer: Self-pay | Admitting: Family Medicine

## 2016-01-18 ENCOUNTER — Ambulatory Visit (INDEPENDENT_AMBULATORY_CARE_PROVIDER_SITE_OTHER): Payer: Medicare Other | Admitting: Family Medicine

## 2016-01-18 VITALS — BP 119/62 | HR 69 | Temp 97.1°F | Ht 61.0 in | Wt 115.4 lb

## 2016-01-18 DIAGNOSIS — Z720 Tobacco use: Secondary | ICD-10-CM

## 2016-01-18 DIAGNOSIS — M810 Age-related osteoporosis without current pathological fracture: Secondary | ICD-10-CM

## 2016-01-18 DIAGNOSIS — E785 Hyperlipidemia, unspecified: Secondary | ICD-10-CM | POA: Diagnosis not present

## 2016-01-18 MED ORDER — SIMVASTATIN 40 MG PO TABS: 40.0000 mg | ORAL_TABLET | Freq: Every day | ORAL | Status: DC

## 2016-01-18 NOTE — Progress Notes (Signed)
   HPI  Patient presents today here for follow-up hyperlipidemia, smoking  Hyper lipidemia Patient watches her diet, she's recreationally active but does not exercise formally. She has good medication compliance, previously had joint and muscle aches with Lipitor. She would like to decrease from Vytorin to plain simvastatin.  Smoking Not ready to quit, contemplative  Osteoporosis She is on prolia.  PMH: Smoking status noted ROS: Per HPI  Objective: BP 119/62 mmHg  Pulse 69  Temp(Src) 97.1 F (36.2 C) (Oral)  Ht '5\' 1"'$  (1.549 m)  Wt 115 lb 6.4 oz (52.345 kg)  BMI 21.82 kg/m2 Gen: NAD, alert, cooperative with exam HEENT: NCAT CV: RRR, good S1/S2, no murmur Resp: CTABL, no wheezes, non-labored Ext: No edema, warm Neuro: Alert and oriented, No gross deficits  Assessment and plan:  # Hyperlipidemia Changing from Vytorin to simvastatin Repeat cholesterol panel today, repeat in 3-4 months.   # Osteoporosis She's holding prolia to try to get dental implants to set better. She will return after 1 month off of her medication.  # Smoking Recommended cessation, she's contemplative not ready to quit   Orders Placed This Encounter  Procedures  . Lipid Panel  . CMP14+EGFR  . CBC with Differential  . Lipid Panel    Standing Status: Future     Number of Occurrences:      Standing Expiration Date: 01/17/2017  . CMP14+EGFR    Standing Status: Future     Number of Occurrences:      Standing Expiration Date: 01/17/2017    Meds ordered this encounter  Medications  . simvastatin (ZOCOR) 40 MG tablet    Sig: Take 1 tablet (40 mg total) by mouth at bedtime.    Dispense:  90 tablet    Refill:  [place], MD [facility] 01/18/2016, 8:18 AM

## 2016-01-18 NOTE — Patient Instructions (Addendum)
Great to see you!  I have changed you from vytorin to simvastatin  We will call with labs within 1 week  Lets see you again in 3-4 months for repeat cholesterol to make sure everything is going as good as we would like.

## 2016-01-19 LAB — CBC WITH DIFFERENTIAL/PLATELET
BASOS ABS: 0 10*3/uL (ref 0.0–0.2)
BASOS: 1 %
EOS (ABSOLUTE): 0.2 10*3/uL (ref 0.0–0.4)
Eos: 3 %
HEMOGLOBIN: 14 g/dL (ref 11.1–15.9)
Hematocrit: 43.3 % (ref 34.0–46.6)
IMMATURE GRANS (ABS): 0 10*3/uL (ref 0.0–0.1)
Immature Granulocytes: 0 %
LYMPHS ABS: 3 10*3/uL (ref 0.7–3.1)
LYMPHS: 41 %
MCH: 28.6 pg (ref 26.6–33.0)
MCHC: 32.3 g/dL (ref 31.5–35.7)
MCV: 89 fL (ref 79–97)
MONOCYTES: 7 %
Monocytes Absolute: 0.5 10*3/uL (ref 0.1–0.9)
NEUTROS ABS: 3.5 10*3/uL (ref 1.4–7.0)
Neutrophils: 48 %
Platelets: 228 10*3/uL (ref 150–379)
RBC: 4.89 x10E6/uL (ref 3.77–5.28)
RDW: 14.2 % (ref 12.3–15.4)
WBC: 7.3 10*3/uL (ref 3.4–10.8)

## 2016-01-19 LAB — LIPID PANEL
Chol/HDL Ratio: 2.6 ratio units (ref 0.0–4.4)
Cholesterol, Total: 164 mg/dL (ref 100–199)
HDL: 63 mg/dL (ref >39)
LDL Calculated: 79 mg/dL (ref 0–99)
Triglycerides: 111 mg/dL (ref 0–149)
VLDL Cholesterol Cal: 22 mg/dL (ref 5–40)

## 2016-01-19 LAB — CMP14+EGFR
A/G RATIO: 1.9 (ref 1.2–2.2)
ALBUMIN: 4.4 g/dL (ref 3.6–4.8)
ALT: 21 IU/L (ref 0–32)
AST: 25 IU/L (ref 0–40)
Alkaline Phosphatase: 73 IU/L (ref 39–117)
BUN / CREAT RATIO: 18 (ref 12–28)
BUN: 12 mg/dL (ref 8–27)
CALCIUM: 9.6 mg/dL (ref 8.7–10.3)
CHLORIDE: 105 mmol/L (ref 96–106)
CO2: 19 mmol/L (ref 18–29)
Creatinine, Ser: 0.65 mg/dL (ref 0.57–1.00)
GFR, EST AFRICAN AMERICAN: 106 mL/min/{1.73_m2} (ref >59)
GFR, EST NON AFRICAN AMERICAN: 92 mL/min/{1.73_m2} (ref >59)
Globulin, Total: 2.3 g/dL (ref 1.5–4.5)
Glucose: 98 mg/dL (ref 65–99)
POTASSIUM: 4.2 mmol/L (ref 3.5–5.2)
Sodium: 145 mmol/L — ABNORMAL HIGH (ref 134–144)
TOTAL PROTEIN: 6.7 g/dL (ref 6.0–8.5)

## 2016-01-28 ENCOUNTER — Telehealth: Payer: Self-pay | Admitting: Pharmacist

## 2016-01-30 NOTE — Telephone Encounter (Signed)
Patient is scheduled for dental implants for 04/2016 with Dr Satira Sark.  Her last Prolia was 08/2015.  Plan is to skip dose of Prolia that is due 01/2016.  Will check DEXA at beginning of 2018 and decide on restart prolia around 07/2016 as long as dental implants are stable.

## 2016-06-12 ENCOUNTER — Encounter: Payer: Self-pay | Admitting: Pharmacist

## 2016-06-12 ENCOUNTER — Ambulatory Visit (INDEPENDENT_AMBULATORY_CARE_PROVIDER_SITE_OTHER): Payer: Medicare Other | Admitting: Pharmacist

## 2016-06-12 VITALS — BP 138/64 | HR 72 | Ht 61.0 in | Wt 111.5 lb

## 2016-06-12 DIAGNOSIS — Z23 Encounter for immunization: Secondary | ICD-10-CM | POA: Diagnosis not present

## 2016-06-12 DIAGNOSIS — M810 Age-related osteoporosis without current pathological fracture: Secondary | ICD-10-CM

## 2016-06-12 DIAGNOSIS — Z0000 Encounter for general adult medical examination without abnormal findings: Secondary | ICD-10-CM

## 2016-06-12 DIAGNOSIS — F17200 Nicotine dependence, unspecified, uncomplicated: Secondary | ICD-10-CM

## 2016-06-12 MED ORDER — TRIAMCINOLONE 0.1 % CREAM:EUCERIN CREAM 1:1: 1.0000 "application " | TOPICAL_CREAM | Freq: Two times a day (BID) | CUTANEOUS | 0 refills | Status: DC

## 2016-06-12 MED ORDER — TRIAMCINOLONE ACETONIDE 0.1 % EX CREA: 1.0000 "application " | TOPICAL_CREAM | Freq: Two times a day (BID) | CUTANEOUS | 0 refills | Status: AC

## 2016-06-12 NOTE — Patient Instructions (Signed)
  Rhonda Kelly , Thank you for taking time to come for your Medicare Wellness Visit. I appreciate your ongoing commitment to your health goals. Please review the following plan we discussed and let me know if I can assist you in the future.   These are the goals we discussed: Look for copy of [place] (important to know where these are kept - you can also bring copy to our office to be placed in our file / electronic chart)    This is a list of the screening recommended for you and due dates:  Health Maintenance  Topic Date Due  . Tetanus Vaccine  03/06/1967  . Flu Shot  01/29/2016  . DEXA scan (bone density measurement)  05/03/2016  . Mammogram  03/18/2017  . Colon Cancer Screening  10/08/2018  . Shingles Vaccine  Completed  .  Hepatitis C: One time screening is recommended by Center for Disease Control  (CDC) for  adults born from [birth year] through 1963.   Completed  . Pneumonia vaccines  Completed

## 2016-06-12 NOTE — Progress Notes (Signed)
Patient ID: Rhonda Kelly, female   DOB: 14-Mar-1946, 69 y.o.   MRN: [id number]    Subjective:   Rhonda Kelly is a 70 y.o. female who presents for a subsequent annual wellness visit.  Mrs. Kinnaird is married.  She has one adult child.  She is retired.    Mrs. Epp has recently has front tooth implant.  Due to this she has skipped her last Prolia injection.  She has follow up with oral surgeon in 1 month.  If no problems noted that will resume Prolia 07/2016.   Current Medications (verified) Outpatient Encounter Prescriptions as of 06/12/2016  Medication Sig  . aspirin 81 MG tablet Take 81 mg by mouth daily.  . Cholecalciferol 1000 units TBDP Take 1,000 Int'l Units by mouth daily.  Marland Kitchen Co-Enzyme Q-10 100 MG CAPS Take 100 mg by mouth daily.  Marland Kitchen KRILL OIL PO Take 1 capsule by mouth daily.  . multivitamin-lutein (OCUVITE-LUTEIN) CAPS capsule Take 1 capsule by mouth daily. icaps Areds 2  . simvastatin (ZOCOR) 40 MG tablet Take 1 tablet (40 mg total) by mouth at bedtime.  Marland Kitchen denosumab (PROLIA) 60 MG/ML SOLN injection Inject 60 mg into the skin every 6 (six) months. Administer in upper arm, thigh, or abdomen (Patient not taking: Reported on 06/12/2016)  . [DISCONTINUED] Calcium Carb-Cholecalciferol (CALCIUM + D3) 600-200 MG-UNIT TABS Take 1 tablet by mouth daily.  . [DISCONTINUED] omeprazole (PRILOSEC) 40 MG capsule Take 1 capsule (40 mg total) by mouth daily. (Patient not taking: Reported on 06/12/2016)   No facility-administered encounter medications on file as of 06/12/2016.     Allergies (verified) Patient has no known allergies.   History: Past Medical History:  Diagnosis Date  . Allergy    some seasonal- otc meds prn only  . Bladder prolapse, female, acquired    surgery pending to repair 09-24-15  . Cataract    small  . Colon polyp   . GERD (gastroesophageal reflux disease)   . Hyperlipidemia   . Macular degeneration   . Osteoporosis   . Thyroid disease    25 years ago- no meds now     Past Surgical History:  Procedure Laterality Date  . ABDOMINAL HYSTERECTOMY Bilateral 1993   leiomyomata, bleeding  . COLONOSCOPY    . [place]   right  . OOPHORECTOMY     BSO  . POLYPECTOMY    . TONSILLECTOMY  1972  . TUBAL LIGATION     Family History  Problem Relation Age of Onset  . Colon cancer Mother 64  . Heart attack Mother     during gall bladder surgery  . Heart disease Mother   . Colon cancer Maternal Grandmother 77  . Cancer Father     prostate  . Diabetes Sister   . Colon polyps Sister   . Diabetes Brother   . Heart attack Brother   . CAD Brother   . Heart attack Brother   . CAD Brother   . Colon cancer Maternal Aunt   . Colon cancer Maternal Uncle   . Stomach cancer Neg Hx    Social History   Occupational History  . Not on file.   Social History Main Topics  . Smoking status: Current Every Day Smoker    Packs/day: 0.50    Years: 45.00    Types: Cigarettes  . Smokeless tobacco: Never Used  . Alcohol use 0.0 oz/week     Comment: rare - 3 times per year  .  Drug use: No  . Sexual activity: Not Currently     Comment: 1st intercourse 70 yo-Fewer than 5 partners    Do you feel safe at home?  Yes  Dietary issues and exercise activities: Current Exercise Habits: The patient does not participate in regular exercise at present (continues to participate in yard work)  Current Dietary habits:  Eats 3 meals a day.  She states that she has been eating softer foods and  Less recently due to temp front tooth / implant.    Objective:    Today's Vitals   06/12/16 1553  BP: 138/64  Pulse: 72  Weight: 111 lb 8 oz (50.6 kg)  Height: 5\' 1"  (1.549 m)   Body mass index is 21.07 kg/m.  Activities of Daily Living In your present state of health, do you have any difficulty performing the following activities: 06/12/2016  Hearing? N  Vision? N  Difficulty concentrating or making decisions? N  Walking or climbing stairs? N  Dressing or  bathing? N  Doing errands, shopping? N  Preparing Food and eating ? N  Using the Toilet? N  In the past six months, have you accidently leaked urine? N  Do you have problems with loss of bowel control? N  Managing your Medications? N  Managing your Finances? N  Housekeeping or managing your Housekeeping? N  Some recent data might be hidden    Are there smokers in your home (other than you)? No   Cardiac Risk Factors include: advanced age (>36men, >55 women);dyslipidemia;family history of premature cardiovascular disease  Depression Screen PHQ 2/9 Scores 06/12/2016 01/18/2016 07/31/2015 04/10/2015  PHQ - 2 Score 0 0 0 0    Fall Risk Fall Risk  06/12/2016 01/18/2016 04/10/2015 02/05/2015 05/03/2014  Falls in the past year? No No No No No    Cognitive Function: MMSE - Mini Mental State Exam 02/05/2015  Orientation to time 5  Orientation to Place 5  Registration 3  Attention/ Calculation 5  Recall 3  Language- name 2 objects 2  Language- repeat 1  Language- follow 3 step command 3  Language- read & follow direction 1  Write a sentence 1  Copy design 1  Total score 30    Immunizations and Health Maintenance Immunization History  Administered Date(s) Administered  . Influenza, High Dose Seasonal PF 06/12/2016  . Influenza,inj,Quad PF,36+ Mos 05/03/2014, 04/10/2015  . Pneumococcal Conjugate-13 05/03/2014  . Pneumococcal Polysaccharide-23 07/31/2015  . Zoster 02/05/2015   Health Maintenance Due  Topic Date Due  . TETANUS/TDAP  03/06/1967  . INFLUENZA VACCINE  01/29/2016  . DEXA SCAN  05/03/2016    Patient Care Team: Timmothy Euler, MD as PCP - General (Family Medicine) Adelina Mings Margret Chance, MD as Referring Physician (Optometry) Irene Shipper, MD as Consulting Physician (Gastroenterology)  Indicate any recent Medical Services you may have received from other than [facility] providers in the past year (date may be approximate).    Assessment:    Annual Wellness Visit    Osteoporosis  Screening Tests Health Maintenance  Topic Date Due  . TETANUS/TDAP  03/06/1967  . INFLUENZA VACCINE  01/29/2016  . DEXA SCAN  05/03/2016  . MAMMOGRAM  03/18/2017  . COLONOSCOPY  10/08/2018  . ZOSTAVAX  Completed  . Hepatitis C Screening  Completed  . PNA vac Low Risk Adult  Completed        Plan:   During the course of the visit Tanika was educated and counseled about the following appropriate  screening and preventive services:   Vaccines to include Pneumoccal, Influenza, Hepatitis B, Td, Zostavax - received influenza in office today.  Plan to get Tdap in 1-2 months  Colorectal cancer screening - UTD - checked about q3 years.  Cardiovascular disease screen - BP and lipids at goals  Diabetes screening - UTD  Bone Denisty / Osteoporosis Screening - due now but unable to do today due to servicing of our office DEXA. Order placed  Continue with plan to restart prolia in 07/2016  Mammogram - schedule in office today  Glaucoma screening /  Eye Exam - UTD  Nutrition counseling - discussed increasing calorie and nutrient dense food such as peanut butter, low fat yogurt, ensure  Smoking cessation counseling - CT of chest ordered today  Advanced Directives - UTD; copy requested  Orders Placed This Encounter  Procedures  . DG [facility] DEXA    Order Specific Question:   Reason for Exam (SYMPTOM  OR DIAGNOSIS REQUIRED)    Answer:   osteoporosis  . CT CHEST LUNG CA SCREEN LOW DOSE W/O CM    Standing Status:   Future    Standing Expiration Date:   09/10/2016    Scheduling Instructions:     Greater than 30 ppy smoking history.    Order Specific Question:   Reason for Exam (SYMPTOM  OR DIAGNOSIS REQUIRED)    Answer:   current smoker greater than 30 pack year    Order Specific Question:   Preferred Imaging Location?    Answer:   [facility]  . Flu vaccine HIGH DOSE PF      Patient Instructions (the written plan) were given to the patient.   Cherre Robins,  PharmD   06/12/2016

## 2016-06-16 ENCOUNTER — Ambulatory Visit (INDEPENDENT_AMBULATORY_CARE_PROVIDER_SITE_OTHER): Payer: Medicare Other

## 2016-06-16 DIAGNOSIS — M810 Age-related osteoporosis without current pathological fracture: Secondary | ICD-10-CM

## 2016-06-24 ENCOUNTER — Ambulatory Visit (HOSPITAL_COMMUNITY)
Admission: RE | Admit: 2016-06-24 | Discharge: 2016-06-24 | Disposition: A | Discharge status: Home / Self Care | Payer: Medicare Other | Source: Ambulatory Visit | Attending: Pharmacist | Admitting: Pharmacist

## 2016-06-24 DIAGNOSIS — J432 Centrilobular emphysema: Secondary | ICD-10-CM | POA: Diagnosis not present

## 2016-06-24 DIAGNOSIS — I2510 Atherosclerotic heart disease of native coronary artery without angina pectoris: Secondary | ICD-10-CM | POA: Insufficient documentation

## 2016-06-24 DIAGNOSIS — I700 Atherosclerosis of aorta: Secondary | ICD-10-CM | POA: Diagnosis not present

## 2016-06-24 DIAGNOSIS — F17200 Nicotine dependence, unspecified, uncomplicated: Secondary | ICD-10-CM | POA: Insufficient documentation

## 2016-06-24 DIAGNOSIS — Z87891 Personal history of nicotine dependence: Secondary | ICD-10-CM | POA: Diagnosis not present

## 2016-06-30 HISTORY — PX: SQUAMOUS CELL CARCINOMA EXCISION: SHX2433

## 2016-07-02 ENCOUNTER — Other Ambulatory Visit: Payer: Self-pay | Admitting: Pharmacist

## 2016-07-02 DIAGNOSIS — R911 Solitary pulmonary nodule: Secondary | ICD-10-CM

## 2016-07-02 DIAGNOSIS — I700 Atherosclerosis of aorta: Secondary | ICD-10-CM

## 2016-07-02 DIAGNOSIS — F17200 Nicotine dependence, unspecified, uncomplicated: Secondary | ICD-10-CM

## 2016-07-11 ENCOUNTER — Encounter (HOSPITAL_COMMUNITY): Payer: Self-pay

## 2016-07-11 ENCOUNTER — Ambulatory Visit (HOSPITAL_COMMUNITY)
Admission: RE | Admit: 2016-07-11 | Discharge: 2016-07-11 | Disposition: A | Discharge status: Home / Self Care | Payer: Medicare Other | Source: Ambulatory Visit | Attending: Family Medicine | Admitting: Family Medicine

## 2016-07-21 ENCOUNTER — Telehealth: Payer: Self-pay | Admitting: Pharmacist

## 2016-08-05 DIAGNOSIS — H2513 Age-related nuclear cataract, bilateral: Secondary | ICD-10-CM | POA: Diagnosis not present

## 2016-08-05 DIAGNOSIS — H40033 Anatomical narrow angle, bilateral: Secondary | ICD-10-CM | POA: Diagnosis not present

## 2016-08-13 MED ORDER — DENOSUMAB 60 MG/ML ~~LOC~~ SOLN
60.0000 mg | SUBCUTANEOUS | 1 refills | Status: DC
Start: 2016-08-13 — End: 2020-03-01

## 2016-08-13 NOTE — Telephone Encounter (Signed)
Called for PA and was approved 08/13/2016 thru 08/13/2017.  Rx for Prolia sent to [facility]

## 2016-10-27 ENCOUNTER — Encounter: Payer: Self-pay | Admitting: *Deleted

## 2016-10-27 DIAGNOSIS — Z1231 Encounter for screening mammogram for malignant neoplasm of breast: Secondary | ICD-10-CM | POA: Diagnosis not present

## 2016-11-28 ENCOUNTER — Ambulatory Visit (INDEPENDENT_AMBULATORY_CARE_PROVIDER_SITE_OTHER): Payer: Medicare Other | Admitting: Family Medicine

## 2016-11-28 ENCOUNTER — Encounter: Payer: Self-pay | Admitting: Family Medicine

## 2016-11-28 VITALS — BP 131/74 | HR 63 | Temp 97.3°F | Ht 61.0 in | Wt 117.0 lb

## 2016-11-28 DIAGNOSIS — T63461A Toxic effect of venom of wasps, accidental (unintentional), initial encounter: Secondary | ICD-10-CM | POA: Diagnosis not present

## 2016-11-28 DIAGNOSIS — S50862A Insect bite (nonvenomous) of left forearm, initial encounter: Secondary | ICD-10-CM | POA: Diagnosis not present

## 2016-11-28 MED ORDER — CEPHALEXIN 500 MG PO CAPS: 500.0000 mg | ORAL_CAPSULE | Freq: Four times a day (QID) | ORAL | 0 refills | Status: DC

## 2016-11-28 NOTE — Progress Notes (Signed)
   BP 131/74   Pulse 63   Temp 97.3 F (36.3 C) (Oral)   Ht 5\' 1"  (1.549 m)   Wt 117 lb (53.1 kg)   BMI 22.11 kg/m    Subjective:    Patient ID: Rhonda Kelly, female    DOB: 1945-07-29, 71 y.o.   MRN: [id number]  HPI: Rhonda Kelly is a 71 y.o. female presenting on 11/28/2016 for Insect Bite (to left arm, happened yesterday, area is now red, swollen and warm to touch)   HPI Insect bites/wasp sting Patient comes in today because she had a wasp sting on her left forearm 2 days ago but that has this redness and warmth that has been spreading over the past 2 days and that is what brought her in today. She denies any fevers or chills or numbness or weakness. The redness has started to spread and covers an area of about 8 cm in diameter. She has been using topical Benadryl which helped with the itch but does not seem to be affecting the spreading of the redness underneath.  Relevant past medical, surgical, family and social history reviewed and updated as indicated. Interim medical history since our last visit reviewed. Allergies and medications reviewed and updated.  Review of Systems  Constitutional: Negative for chills and fever.  Respiratory: Negative for chest tightness and shortness of breath.   Cardiovascular: Negative for chest pain and leg swelling.  Musculoskeletal: Negative for back pain and gait problem.  Skin: Positive for color change. Negative for rash.  Neurological: Negative for light-headedness and headaches.  Psychiatric/Behavioral: Negative for agitation and behavioral problems.  All other systems reviewed and are negative.   Per HPI unless specifically indicated above        Objective:    BP 131/74   Pulse 63   Temp 97.3 F (36.3 C) (Oral)   Ht 5\' 1"  (1.549 m)   Wt 117 lb (53.1 kg)   BMI 22.11 kg/m   Wt Readings from Last 3 Encounters:  11/28/16 117 lb (53.1 kg)  06/12/16 111 lb 8 oz (50.6 kg)  01/18/16 115 lb 6.4 oz (52.3 kg)    Physical Exam    Constitutional: She is oriented to person, place, and time. She appears well-developed and well-nourished. No distress.  Eyes: Conjunctivae are normal.  Musculoskeletal: Normal range of motion.  Neurological: She is alert and oriented to person, place, and time. Coordination normal.  Skin: Skin is warm and dry. Ecchymosis and lesion noted. No rash noted. She is not diaphoretic.     Psychiatric: She has a normal mood and affect. Her behavior is normal.  Nursing note and vitals reviewed.       Assessment & Plan:   Problem List Items Addressed This Visit    None    Visit Diagnoses    Wasp sting, accidental or unintentional, initial encounter    -  Primary   Abnormal bruising pattern, possible infection, treat preventatively, recommended antihistamine and stopping her aspirin for a week   Relevant Medications   cephALEXin (KEFLEX) 500 MG capsule       Follow up plan: Return if symptoms worsen or fail to improve.  Counseling provided for all of the vaccine components No orders of the defined types were placed in this encounter.   Caryl Pina, MD [facility] 11/28/2016, 11:58 AM

## 2016-12-08 ENCOUNTER — Other Ambulatory Visit: Payer: Self-pay | Admitting: Family Medicine

## 2016-12-29 DIAGNOSIS — R238 Other skin changes: Secondary | ICD-10-CM | POA: Diagnosis not present

## 2017-01-02 DIAGNOSIS — D485 Neoplasm of uncertain behavior of skin: Secondary | ICD-10-CM | POA: Diagnosis not present

## 2017-01-11 DIAGNOSIS — B079 Viral wart, unspecified: Secondary | ICD-10-CM | POA: Diagnosis not present

## 2017-01-11 DIAGNOSIS — C4492 Squamous cell carcinoma of skin, unspecified: Secondary | ICD-10-CM | POA: Diagnosis not present

## 2017-01-14 DIAGNOSIS — C44729 Squamous cell carcinoma of skin of left lower limb, including hip: Secondary | ICD-10-CM | POA: Diagnosis not present

## 2017-03-31 DIAGNOSIS — C44729 Squamous cell carcinoma of skin of left lower limb, including hip: Secondary | ICD-10-CM | POA: Diagnosis not present

## 2017-04-08 DIAGNOSIS — C44729 Squamous cell carcinoma of skin of left lower limb, including hip: Secondary | ICD-10-CM | POA: Diagnosis not present

## 2017-04-13 ENCOUNTER — Other Ambulatory Visit: Payer: Self-pay

## 2017-04-13 ENCOUNTER — Telehealth: Payer: Self-pay | Admitting: Family Medicine

## 2017-04-13 DIAGNOSIS — E785 Hyperlipidemia, unspecified: Secondary | ICD-10-CM

## 2017-04-13 NOTE — Progress Notes (Unsigned)
Future orders placed for apt.

## 2017-04-13 NOTE — Telephone Encounter (Signed)
Labs placed. Left detailed message.

## 2017-04-15 ENCOUNTER — Other Ambulatory Visit: Payer: Medicare Other

## 2017-04-15 DIAGNOSIS — E785 Hyperlipidemia, unspecified: Secondary | ICD-10-CM

## 2017-04-16 LAB — CMP14+EGFR
ALT: 14 IU/L (ref 0–32)
AST: 23 IU/L (ref 0–40)
Albumin/Globulin Ratio: 2 (ref 1.2–2.2)
Albumin: 4.5 g/dL (ref 3.6–4.8)
Alkaline Phosphatase: 86 IU/L (ref 39–117)
BILIRUBIN TOTAL: 0.3 mg/dL (ref 0.0–1.2)
BUN / CREAT RATIO: 15 (ref 12–28)
BUN: 10 mg/dL (ref 8–27)
CO2: 24 mmol/L (ref 20–29)
CREATININE: 0.68 mg/dL (ref 0.57–1.00)
Calcium: 9.8 mg/dL (ref 8.7–10.3)
Chloride: 102 mmol/L (ref 96–106)
GFR, EST AFRICAN AMERICAN: 103 mL/min/{1.73_m2} (ref >59)
GFR, EST NON AFRICAN AMERICAN: 90 mL/min/{1.73_m2} (ref >59)
GLUCOSE: 96 mg/dL (ref 65–99)
Globulin, Total: 2.3 g/dL (ref 1.5–4.5)
Potassium: 4 mmol/L (ref 3.5–5.2)
Sodium: 141 mmol/L (ref 134–144)
Total Protein: 6.8 g/dL (ref 6.0–8.5)

## 2017-04-16 LAB — CBC WITH DIFFERENTIAL/PLATELET
BASOS: 1 %
Basophils Absolute: 0.1 10*3/uL (ref 0.0–0.2)
EOS (ABSOLUTE): 0.2 10*3/uL (ref 0.0–0.4)
EOS: 3 %
HEMATOCRIT: 44.2 % (ref 34.0–46.6)
HEMOGLOBIN: 14.2 g/dL (ref 11.1–15.9)
Immature Grans (Abs): 0 10*3/uL (ref 0.0–0.1)
Immature Granulocytes: 0 %
LYMPHS ABS: 2.6 10*3/uL (ref 0.7–3.1)
Lymphs: 36 %
MCH: 28.6 pg (ref 26.6–33.0)
MCHC: 32.1 g/dL (ref 31.5–35.7)
MCV: 89 fL (ref 79–97)
MONOCYTES: 9 %
Monocytes Absolute: 0.6 10*3/uL (ref 0.1–0.9)
Neutrophils Absolute: 3.8 10*3/uL (ref 1.4–7.0)
Neutrophils: 51 %
Platelets: 264 10*3/uL (ref 150–379)
RBC: 4.96 x10E6/uL (ref 3.77–5.28)
RDW: 14 % (ref 12.3–15.4)
WBC: 7.3 10*3/uL (ref 3.4–10.8)

## 2017-04-16 LAB — LIPID PANEL
Chol/HDL Ratio: 3.5 ratio (ref 0.0–4.4)
Cholesterol, Total: 208 mg/dL — ABNORMAL HIGH (ref 100–199)
HDL: 59 mg/dL (ref >39)
LDL CALC: 127 mg/dL — AB (ref 0–99)
TRIGLYCERIDES: 111 mg/dL (ref 0–149)
VLDL Cholesterol Cal: 22 mg/dL (ref 5–40)

## 2017-04-16 LAB — TSH: TSH: 6.77 u[IU]/mL — ABNORMAL HIGH (ref 0.450–4.500)

## 2017-04-20 ENCOUNTER — Ambulatory Visit (INDEPENDENT_AMBULATORY_CARE_PROVIDER_SITE_OTHER): Payer: Medicare Other

## 2017-04-20 ENCOUNTER — Ambulatory Visit (INDEPENDENT_AMBULATORY_CARE_PROVIDER_SITE_OTHER): Payer: Medicare Other | Admitting: Family Medicine

## 2017-04-20 VITALS — BP 134/67 | HR 59 | Temp 96.7°F | Ht 61.0 in | Wt 114.2 lb

## 2017-04-20 DIAGNOSIS — M25532 Pain in left wrist: Secondary | ICD-10-CM | POA: Diagnosis not present

## 2017-04-20 DIAGNOSIS — Z23 Encounter for immunization: Secondary | ICD-10-CM | POA: Diagnosis not present

## 2017-04-20 DIAGNOSIS — R946 Abnormal results of thyroid function studies: Secondary | ICD-10-CM | POA: Diagnosis not present

## 2017-04-20 DIAGNOSIS — R7989 Other specified abnormal findings of blood chemistry: Secondary | ICD-10-CM | POA: Diagnosis not present

## 2017-04-20 DIAGNOSIS — Z85828 Personal history of other malignant neoplasm of skin: Secondary | ICD-10-CM | POA: Insufficient documentation

## 2017-04-20 MED ORDER — SIMVASTATIN 40 MG PO TABS: ORAL_TABLET | ORAL | 3 refills | Status: DC

## 2017-04-20 NOTE — Progress Notes (Signed)
   HPI  Patient presents today here to review labs and discuss wrist pain.  Patient was recently seen for tick bite, she states that the lesion persisted and she finally had it evaluated with dermatology, it turned out to be a squamous cell carcinoma.  She is very confident that she had a tick removed at the beginning of the issue.  Patient has history of slightly low thyroid and needing to take Synthroid, no symptoms today.  We discussed her TSH.  We will repeat in the lab.  Left wrist pain Approximately 2 months of left wrist pain and swelling.  Patient states that she did not have any discrete injury but does do lots of yard work. She would like it investigated further.  Patient would like to restart Prolia, she stopped for about 1 year to have dental implant taking care of  PMH: Smoking status noted ROS: Per HPI  Objective: BP 134/67   Pulse (!) 59   Temp (!) 96.7 F (35.9 C) (Oral)   Ht 5\' 1"  (1.549 m)   Wt 114 lb 3.2 oz (51.8 kg)   BMI 21.58 kg/m  Gen: NAD, alert, cooperative with exam HEENT: NCAT CV: RRR, good S1/S2, no murmur Resp: CTABL, no wheezes, non-labored Ext: No edema, warm Neuro: Alert and oriented, No gross deficits MSK: Mild tenderness to palpation and swelling at the distal ulna  Assessment and plan:  #Left wrist pain With swelling, approximately 8-9 months after stopping Prolia, x-ray today   #Elevated TSH Asymptomatic Repeat labs, discussed starting Synthroid if elevated  #History of squamous cell carcinoma of the skin Patient with recent diagnosis of squamous cell carcinoma and wide excision by dermatology.  Patient states it started with a tick bite.  She was treated with doxycycline which did not improve the bite.  No remaining lesion, doing well   Orders Placed This Encounter  Procedures  . DG Wrist Complete Left    Standing Status:   Future    Standing Expiration Date:   06/20/2018    Order Specific Question:   Reason for Exam  (SYMPTOM  OR DIAGNOSIS REQUIRED)    Answer:   L wrist pain and swelling    Order Specific Question:   Preferred imaging location?    Answer:   Internal    Order Specific Question:   Radiology Contrast Protocol - do NOT remove file path    Answer:   [email or web address]  . Thyroid Panel With TSH     Laroy Apple, MD [facility] 04/20/2017, 3:29 PM

## 2017-04-20 NOTE — Patient Instructions (Signed)
Great to see you!  We will arrange the prolia  We will call with Xray and lab results within 1 week

## 2017-04-20 NOTE — Addendum Note (Signed)
Addended by: Nigel Berthold C on: 04/20/2017 03:54 PM   Modules accepted: Orders

## 2017-04-21 ENCOUNTER — Other Ambulatory Visit: Payer: Self-pay | Admitting: Family Medicine

## 2017-04-21 LAB — THYROID PANEL WITH TSH
FREE THYROXINE INDEX: 2 (ref 1.2–4.9)
T3 UPTAKE RATIO: 24 % (ref 24–39)
T4, Total: 8.3 ug/dL (ref 4.5–12.0)
TSH: 5.77 u[IU]/mL — ABNORMAL HIGH (ref 0.450–4.500)

## 2017-04-21 MED ORDER — LEVOTHYROXINE SODIUM 50 MCG PO TABS: 50.0000 ug | ORAL_TABLET | Freq: Every day | ORAL | 3 refills | Status: DC

## 2017-07-08 ENCOUNTER — Ambulatory Visit (INDEPENDENT_AMBULATORY_CARE_PROVIDER_SITE_OTHER): Payer: Medicare Other | Admitting: *Deleted

## 2017-07-08 VITALS — BP 129/64 | HR 59 | Ht 61.0 in | Wt 115.0 lb

## 2017-07-08 DIAGNOSIS — Z23 Encounter for immunization: Secondary | ICD-10-CM

## 2017-07-08 DIAGNOSIS — Z0000 Encounter for general adult medical examination without abnormal findings: Secondary | ICD-10-CM

## 2017-07-08 NOTE — Patient Instructions (Signed)
Keep you the great work on your diet and active lifestyle!  Please work on increasing your fruit intake by 1 serving per day.   At your convenience, please bring a copy of your advanced directives to our office to be filed in your medical record.   You received your 1st Shingrix vaccine today.  You will need the 2nd dose after 09/05/16.  I will ask Drenda Freeze, RN to call you regarding restarting Prolia.  Thank you for coming in for your Annual Wellness Visit today, it was so nice meeting you!    Preventive Care 20 Years and Older, Female Preventive care refers to lifestyle choices and visits with your health care provider that can promote health and wellness. What does preventive care include?  A yearly physical exam. This is also called an annual well check.  Dental exams once or twice a year.  Routine eye exams. Ask your health care provider how often you should have your eyes checked.  Personal lifestyle choices, including: ? Daily care of your teeth and gums. ? Regular physical activity. ? Eating a healthy diet. ? Avoiding tobacco and drug use. ? Limiting alcohol use. ? Practicing safe sex. ? Taking low-dose aspirin every day. ? Taking vitamin and mineral supplements as recommended by your health care provider. What happens during an annual well check? The services and screenings done by your health care provider during your annual well check will depend on your age, overall health, lifestyle risk factors, and family history of disease. Counseling Your health care provider may ask you questions about your:  Alcohol use.  Tobacco use.  Drug use.  Emotional well-being.  Home and relationship well-being.  Sexual activity.  Eating habits.  History of falls.  Memory and ability to understand (cognition).  Work and work Statistician.  Reproductive health.  Screening You may have the following tests or measurements:  Height, weight, and BMI.  Blood  pressure.  Lipid and cholesterol levels. These may be checked every 5 years, or more frequently if you are over 50 years old.  Skin check.  Lung cancer screening. You may have this screening every year starting at age 32 if you have a 30-pack-year history of smoking and currently smoke or have quit within the past 15 years.  Fecal occult blood test (FOBT) of the stool. You may have this test every year starting at age 16.  Flexible sigmoidoscopy or colonoscopy. You may have a sigmoidoscopy every 5 years or a colonoscopy every 10 years starting at age 103.  Hepatitis C blood test.  Hepatitis B blood test.  Sexually transmitted disease (STD) testing.  Diabetes screening. This is done by checking your blood sugar (glucose) after you have not eaten for a while (fasting). You may have this done every 1-3 years.  Bone density scan. This is done to screen for osteoporosis. You may have this done starting at age 69.  Mammogram. This may be done every 1-2 years. Talk to your health care provider about how often you should have regular mammograms.  Talk with your health care provider about your test results, treatment options, and if necessary, the need for more tests. Vaccines Your health care provider may recommend certain vaccines, such as:  Influenza vaccine. This is recommended every year.  Tetanus, diphtheria, and acellular pertussis (Tdap, Td) vaccine. You may need a Td booster every 10 years.  Varicella vaccine. You may need this if you have not been vaccinated.  Zoster vaccine. You may need this  after age 36.  Measles, mumps, and rubella (MMR) vaccine. You may need at least one dose of MMR if you were born in 1957 or later. You may also need a second dose.  Pneumococcal 13-valent conjugate (PCV13) vaccine. One dose is recommended after age 77.  Pneumococcal polysaccharide (PPSV23) vaccine. One dose is recommended after age 8.  Meningococcal vaccine. You may need this if you  have certain conditions.  Hepatitis A vaccine. You may need this if you have certain conditions or if you travel or work in places where you may be exposed to hepatitis A.  Hepatitis B vaccine. You may need this if you have certain conditions or if you travel or work in places where you may be exposed to hepatitis B.  Haemophilus influenzae type b (Hib) vaccine. You may need this if you have certain conditions.  Talk to your health care provider about which screenings and vaccines you need and how often you need them. This information is not intended to replace advice given to you by your health care provider. Make sure you discuss any questions you have with your health care provider. Document Released: 07/13/2015 Document Revised: 03/05/2016 Document Reviewed: 04/17/2015 Elsevier Interactive Patient Education  [employer].

## 2017-07-08 NOTE — Progress Notes (Addendum)
Subjective:   Rhonda Kelly is a 72 y.o. female who presents for Medicare Annual (Subsequent) preventive examination.  She is married and lives with her husband.  She has a daughter and 2 grand daughters.  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  She would like to restart Prolia, this was put on hold due to a dental implant.    Review of Systems:  All negative today       Objective:     Vitals: BP 129/64   Pulse (!) 59   Ht 5\' 1"  (1.549 m)   Wt 115 lb (52.2 kg)   BMI 21.73 kg/m   Body mass index is 21.73 kg/m.  Advanced Directives 07/08/2017 07/08/2017 06/12/2016 09/24/2015 02/05/2015  Does Patient Have a Medical Advance Directive? - Yes Yes Yes Yes  Type of Advance Directive - [place];Living will [place];Living will Living will [place];Living will  Does patient want to make changes to medical advance directive? - - No - Patient declined - No - Patient declined  Copy of [place] in Chart? No - copy requested No - copy requested No - copy requested - No - copy requested    Tobacco Social History   Tobacco Use  Smoking Status Current Every Day Smoker  . Packs/day: 0.50  . Years: 45.00  . Pack years: 22.50  . Types: Cigarettes  Smokeless Tobacco Never Used     Ready to quit: No Counseling given: Yes  Discussed smoking cessation, and treatment options to help with being successful.  Advised her to discuss with Dr. Wendi Snipes when she is ready to quit.   Clinical Intake:     Pain Score: 0-No pain                 Past Medical History:  Diagnosis Date  . Allergy    some seasonal- otc meds prn only  . Bladder prolapse, female, acquired    surgery pending to repair 09-24-15  . Cataract    small  . Colon polyp   . GERD (gastroesophageal reflux disease)   . Hyperlipidemia   . Macular degeneration   . Osteoporosis   . Thyroid disease    25 years  ago- no meds now    Past Surgical History:  Procedure Laterality Date  . ABDOMINAL HYSTERECTOMY Bilateral 1993   leiomyomata, bleeding  . COLONOSCOPY    . [place]   right  . OOPHORECTOMY     BSO  . POLYPECTOMY    . TONSILLECTOMY  1972  . TUBAL LIGATION     Family History  Problem Relation Age of Onset  . Colon cancer Mother 63  . Heart attack Mother        during gall bladder surgery  . Heart disease Mother   . Colon cancer Maternal Grandmother 77  . Cancer Father        prostate  . Diabetes Sister   . Colon polyps Sister   . Diabetes Brother   . Heart attack Brother   . CAD Brother   . Heart attack Brother   . CAD Brother   . Colon cancer Maternal Aunt   . Colon cancer Maternal Uncle   . Stomach cancer Neg Hx    Social History   Socioeconomic History  . Marital status: Married    Spouse name: None  . Number of children: None  .  Years of education: None  . Highest education level: None  Social Needs  . Financial resource strain: None  . Food insecurity - worry: None  . Food insecurity - inability: None  . Transportation needs - medical: None  . Transportation needs - non-medical: None  Occupational History  . None  Tobacco Use  . Smoking status: Current Every Day Smoker    Packs/day: 0.50    Years: 45.00    Pack years: 22.50    Types: Cigarettes  . Smokeless tobacco: Never Used  Substance and Sexual Activity  . Alcohol use: Yes    Alcohol/week: 0.0 oz    Comment: rare - 3 times per year  . Drug use: No  . Sexual activity: Not Currently    Comment: 1st intercourse 72 yo-Fewer than 5 partners  Other Topics Concern  . None  Social History Narrative  . None    Outpatient Encounter Medications as of 07/08/2017  Medication Sig  . aspirin 81 MG tablet Take 81 mg by mouth daily.  . Cholecalciferol 1000 units TBDP Take 1,000 Int'l Units by mouth daily.  Marland Kitchen Co-Enzyme Q-10 100 MG CAPS Take 100 mg by mouth daily.  Marland Kitchen denosumab (PROLIA) 60 MG/ML  SOLN injection Inject 60 mg into the skin every 6 (six) months. Administer in upper arm, thigh, or abdomen  . KRILL OIL PO Take 1 capsule by mouth daily.  Marland Kitchen levothyroxine (SYNTHROID, LEVOTHROID) 50 MCG tablet Take 1 tablet (50 mcg total) by mouth daily.  . multivitamin-lutein (OCUVITE-LUTEIN) CAPS capsule Take 1 capsule by mouth daily. icaps Areds 2  . simvastatin (ZOCOR) 40 MG tablet TAKE ONE (1) TABLET AT BEDTIME   No facility-administered encounter medications on file as of 07/08/2017.     Activities of Daily Living In your present state of health, do you have any difficulty performing the following activities: 07/08/2017  Hearing? N  Vision? N  Difficulty concentrating or making decisions? N  Walking or climbing stairs? N  Dressing or bathing? N  Doing errands, shopping? N  Preparing Food and eating ? N  Using the Toilet? N  In the past six months, have you accidently leaked urine? N  Do you have problems with loss of bowel control? N  Managing your Medications? N  Managing your Finances? N  Housekeeping or managing your Housekeeping? N  Some recent data might be hidden    Patient Care Team: Timmothy Euler, MD as PCP - General (Family Medicine) Harlen Labs, MD as Referring Physician (Optometry) Irene Shipper, MD as Consulting Physician (Gastroenterology)    Assessment:   This is a routine wellness examination for Rhonda Kelly.  Exercise Activities and Dietary recommendations Current Exercise Habits: The patient does not participate in regular exercise at present(Patient is very active with taking care of her home and yard, and caring for her 3 year old aunt, but does not participate in regular planned exercise), Exercise limited by: None identified  Patient describes a balanced diet of mostly vegetables, lean proteins, and whole grains.  She would like to add more fruit to her diet   Goals    . DIET - EAT MORE FRUITS      Add at least 1 serving of fruit to diet per day.         Fall Risk Fall Risk  07/08/2017 04/20/2017 11/28/2016 06/12/2016 01/18/2016  Falls in the past year? No No No No No     Depression Screen PHQ 2/9 Scores 07/08/2017 04/20/2017 11/28/2016  06/12/2016  PHQ - 2 Score 0 0 0 0     Cognitive Function MMSE - Mini Mental State Exam 07/08/2017 02/05/2015  Orientation to time 5 5  Orientation to Place 5 5  Registration 3 3  Attention/ Calculation 5 5  Recall 3 3  Language- name 2 objects 2 2  Language- repeat 1 1  Language- follow 3 step command 3 3  Language- read & follow direction 1 1  Write a sentence 1 1  Copy design 1 1  Total score 30 30       Immunization History  Administered Date(s) Administered  . Influenza, High Dose Seasonal PF 06/12/2016, 04/20/2017  . Influenza,inj,Quad PF,6+ Mos 05/03/2014, 04/10/2015  . Pneumococcal Conjugate-13 05/03/2014  . Pneumococcal Polysaccharide-23 07/31/2015  . Zoster 02/05/2015    Qualifies for Shingles Vaccine? Yes, 1st dose given today  Screening Tests Health Maintenance  Topic Date Due  . TETANUS/TDAP  03/06/1967  . DEXA SCAN  07/02/2018  . COLONOSCOPY  10/08/2018  . MAMMOGRAM  10/28/2018  . INFLUENZA VACCINE  Completed  . Hepatitis C Screening  Completed  . PNA vac Low Risk Adult  Completed   Tdap declined today   Cancer Screenings: Lung: Low Dose CT Chest recommended if Age 69-80 years, 30 pack-year currently smoking OR have quit w/in 15years. Patient does qualify. Breast:  Up to date on Mammogram? Yes   Up to date of Bone Density/Dexa? Yes Colorectal: Up to date       Plan:     Increase fruit intake by 1 serving per day.  Bring a copy of your advanced directives to our office to be filed in your medical record.  Get 2nd Shingrix dose after 09/05/16.    I have personally reviewed and noted the following in the patient's chart:   . Medical and social history . Use of alcohol, tobacco or illicit drugs  . Current medications and supplements . Functional ability  and status . Nutritional status . Physical activity . Advanced directives . List of other physicians . Hospitalizations, surgeries, and ER visits in previous 12 months . Vitals . Screenings to include cognitive, depression, and falls . Referrals and appointments  In addition, I have reviewed and discussed with patient certain preventive protocols, quality metrics, and best practice recommendations. A written personalized care plan for preventive services as well as general preventive health recommendations were provided to patient.     WYATT, AMY M, RN  07/08/2017   I have reviewed and agree with the above AWV documentation.   Terald Sleeper PA-C [facility] [address]  [place], [place] [address] [phone]

## 2017-08-03 ENCOUNTER — Ambulatory Visit: Payer: Self-pay | Admitting: Family Medicine

## 2017-08-04 ENCOUNTER — Encounter: Payer: Self-pay | Admitting: Family Medicine

## 2017-08-18 ENCOUNTER — Telehealth: Payer: Self-pay | Admitting: *Deleted

## 2017-08-18 NOTE — Telephone Encounter (Signed)
Prolia injection is due and is available for administration. Patient was to have it at her last visit with Dr Wendi Snipes, but she did not make that visit.   Left message to call back so that we can schedule an appointment to come in for Prolia injection.

## 2017-09-02 ENCOUNTER — Other Ambulatory Visit: Payer: Self-pay | Admitting: *Deleted

## 2017-09-02 ENCOUNTER — Other Ambulatory Visit: Payer: Medicare Other

## 2017-09-02 ENCOUNTER — Ambulatory Visit (INDEPENDENT_AMBULATORY_CARE_PROVIDER_SITE_OTHER): Payer: Medicare Other | Admitting: *Deleted

## 2017-09-02 DIAGNOSIS — E038 Other specified hypothyroidism: Secondary | ICD-10-CM

## 2017-09-02 DIAGNOSIS — E7849 Other hyperlipidemia: Secondary | ICD-10-CM

## 2017-09-02 DIAGNOSIS — M810 Age-related osteoporosis without current pathological fracture: Secondary | ICD-10-CM

## 2017-09-02 DIAGNOSIS — E039 Hypothyroidism, unspecified: Secondary | ICD-10-CM

## 2017-09-02 MED ORDER — DENOSUMAB 60 MG/ML ~~LOC~~ SOLN
60.0000 mg | Freq: Once | SUBCUTANEOUS | Status: AC
  Administered 2017-09-02: 60 mg via SUBCUTANEOUS

## 2017-09-02 NOTE — Progress Notes (Addendum)
Pt given Prolia inj Tolerated well Clinic supplied

## 2017-09-03 LAB — TSH: TSH: 2.27 u[IU]/mL (ref 0.450–4.500)

## 2017-09-07 ENCOUNTER — Encounter: Payer: Self-pay | Admitting: Family Medicine

## 2017-09-07 ENCOUNTER — Ambulatory Visit (INDEPENDENT_AMBULATORY_CARE_PROVIDER_SITE_OTHER): Payer: Medicare Other | Admitting: Family Medicine

## 2017-09-07 VITALS — BP 130/66 | HR 63 | Temp 96.7°F | Ht 71.0 in | Wt 115.4 lb

## 2017-09-07 DIAGNOSIS — Z720 Tobacco use: Secondary | ICD-10-CM

## 2017-09-07 DIAGNOSIS — E785 Hyperlipidemia, unspecified: Secondary | ICD-10-CM | POA: Diagnosis not present

## 2017-09-07 DIAGNOSIS — E039 Hypothyroidism, unspecified: Secondary | ICD-10-CM | POA: Diagnosis not present

## 2017-09-07 MED ORDER — VARENICLINE TARTRATE 1 MG PO TABS: 1.0000 mg | ORAL_TABLET | Freq: Two times a day (BID) | ORAL | 3 refills | Status: DC

## 2017-09-07 MED ORDER — VARENICLINE TARTRATE 0.5 MG X 11 & 1 MG X 42 PO MISC: ORAL | 0 refills | Status: DC

## 2017-09-07 NOTE — Progress Notes (Signed)
   HPI  Patient presents today here for follow up   Hypothyroidism Good medication compliance and tolerance of Synthroid.  Hyperlipidemia Started simvastatin after last visit, patient reports good tolerance Not watching diet very carefully currently, she has been working closely with her brother who is had a stroke recently and been in and out of healthcare facilities.  This is made it difficult to watch her diet.  Smoking Would like to try Chantix, did well with it previously  PMH: Smoking status noted ROS: Per HPI  Objective: BP 130/66   Pulse 63   Temp (!) 96.7 F (35.9 C) (Oral)   Ht 5\' 11"  (1.803 m)   Wt 115 lb 6.4 oz (52.3 kg)   BMI 16.10 kg/m  Gen: NAD, alert, cooperative with exam HEENT: NCAT CV: RRR, good S1/S2, no murmur Resp: CTABL, no wheezes, non-labored Ext: No edema, warm Neuro: Alert and oriented, No gross deficits  Assessment and plan:  #Hyperlipidemia Tolerating simvastatin Labs added on previous blood draw today   #Hypothyroidism TSH controlled Continue Synthroid at current dose   #Tobacco abuse Chantix  Meds ordered this encounter  Medications  . varenicline (CHANTIX STARTING MONTH PAK) 0.5 MG X 11 & 1 MG X 42 tablet    Sig: Take one 0.5 mg tablet by mouth once daily for 3 days, then increase to one 0.5 mg tablet twice daily for 4 days, then increase to one 1 mg tablet twice daily.    Dispense:  53 tablet    Refill:  0  . varenicline (CHANTIX CONTINUING MONTH PAK) 1 MG tablet    Sig: Take 1 tablet (1 mg total) by mouth 2 (two) times daily.    Dispense:  60 tablet    Refill:  [place], MD [place] 09/07/2017, 1:40 PM

## 2017-09-07 NOTE — Patient Instructions (Signed)
Great to see you!   

## 2017-09-10 LAB — CMP14+EGFR
ALT: 16 IU/L (ref 0–32)
AST: 20 IU/L (ref 0–40)
Albumin/Globulin Ratio: 2 (ref 1.2–2.2)
Albumin: 4.4 g/dL (ref 3.6–4.8)
Alkaline Phosphatase: 88 IU/L (ref 39–117)
BUN/Creatinine Ratio: 14 (ref 12–28)
BUN: 9 mg/dL (ref 8–27)
Bilirubin Total: <0.2 mg/dL (ref 0.0–1.2)
CALCIUM: 9.5 mg/dL (ref 8.7–10.3)
CO2: 19 mmol/L — AB (ref 20–29)
CREATININE: 0.63 mg/dL (ref 0.57–1.00)
Chloride: 102 mmol/L (ref 96–106)
GFR calc Af Amer: 106 mL/min/{1.73_m2} (ref >59)
GFR, EST NON AFRICAN AMERICAN: 92 mL/min/{1.73_m2} (ref >59)
GLOBULIN, TOTAL: 2.2 g/dL (ref 1.5–4.5)
GLUCOSE: 85 mg/dL (ref 65–99)
Potassium: 4.2 mmol/L (ref 3.5–5.2)
Sodium: 139 mmol/L (ref 134–144)
Total Protein: 6.6 g/dL (ref 6.0–8.5)

## 2017-09-10 LAB — LIPID PANEL
CHOL/HDL RATIO: 3.6 ratio (ref 0.0–4.4)
CHOLESTEROL TOTAL: 220 mg/dL — AB (ref 100–199)
HDL: 61 mg/dL (ref >39)
LDL Calculated: 130 mg/dL — ABNORMAL HIGH (ref 0–99)
TRIGLYCERIDES: 144 mg/dL (ref 0–149)
VLDL CHOLESTEROL CAL: 29 mg/dL (ref 5–40)

## 2017-09-10 LAB — SPECIMEN STATUS REPORT

## 2017-11-01 IMAGING — CT CT CHEST LUNG CANCER SCREENING LOW DOSE W/O CM
1 of 3 series · 10 of 30 positions shown, 13 images · non-contrast
Comparison: Chest CT 09/09/2012.

CLINICAL DATA: 62-year-old female current smoker with 36 pack-year
history of smoking. Lung cancer screening examination.

EXAM:
CT CHEST WITHOUT CONTRAST LOW-DOSE FOR LUNG CANCER SCREENING
TECHNIQUE: Multidetector CT imaging of the chest was performed following the
standard protocol without IV contrast.

[ct lung segmentation data · axial · 0.64mm/px · z∈[-348,-348]mm · 10 of 313 frames shown]
[frame 1/313  mediastinal]
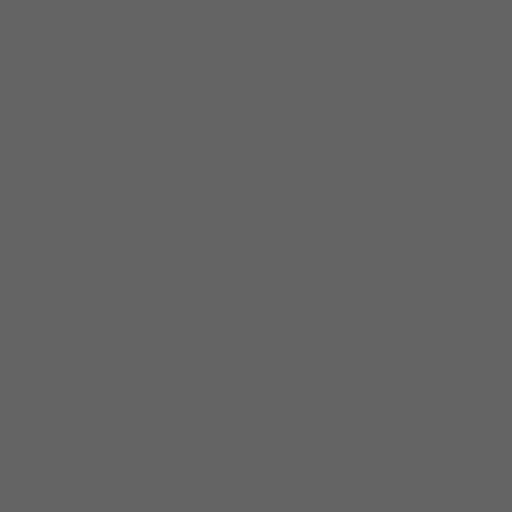
[frame 1/313  lung]
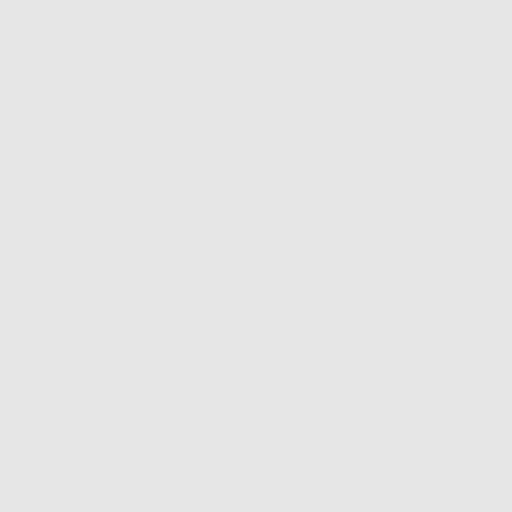
[frame 35/313  lung]
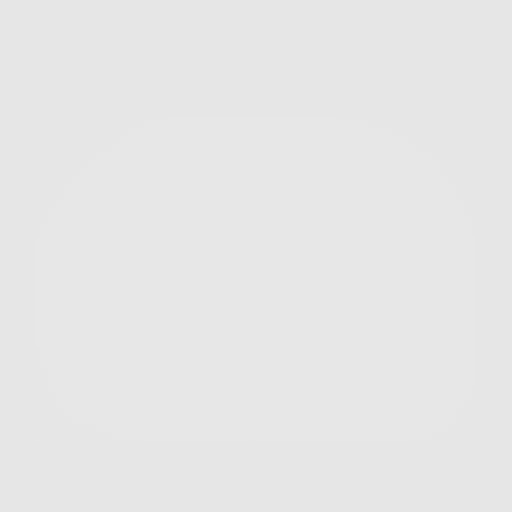
[frame 70/313  lung]
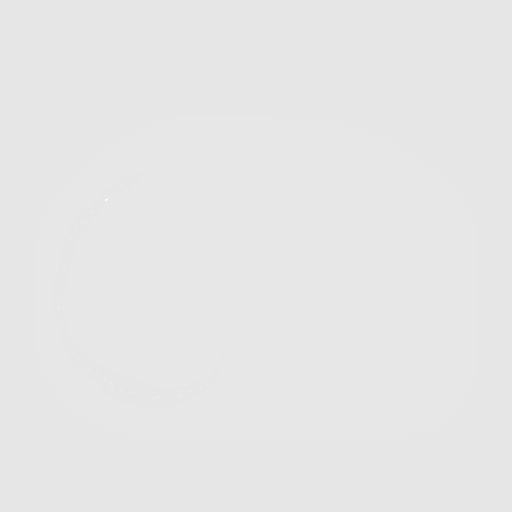
[frame 105/313  lung]
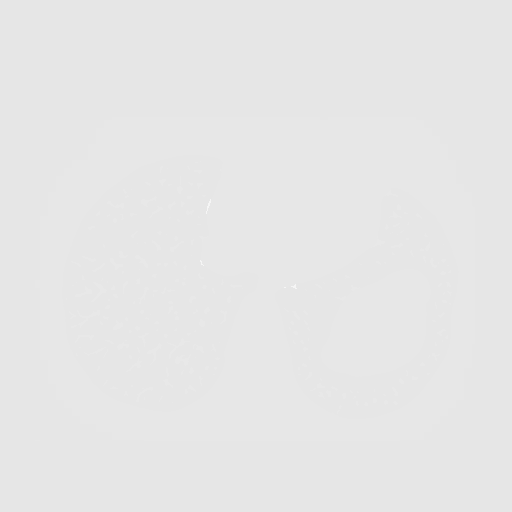
[frame 139/313  mediastinal]
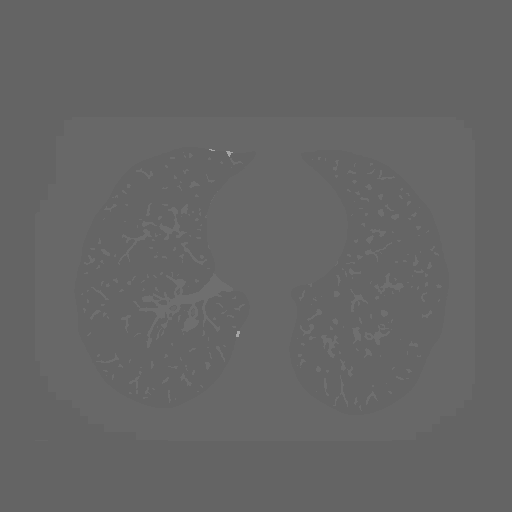
[frame 139/313  lung]
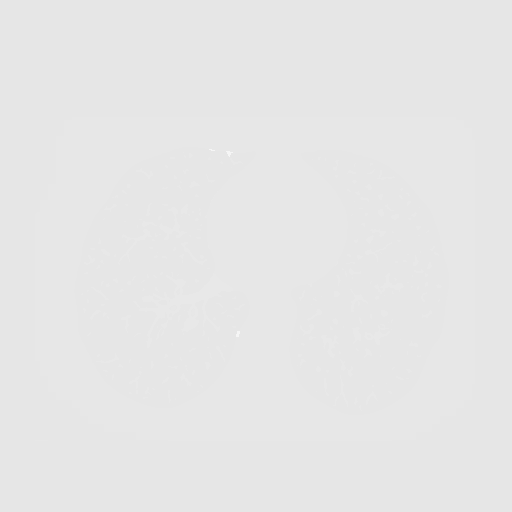
[frame 174/313  lung]
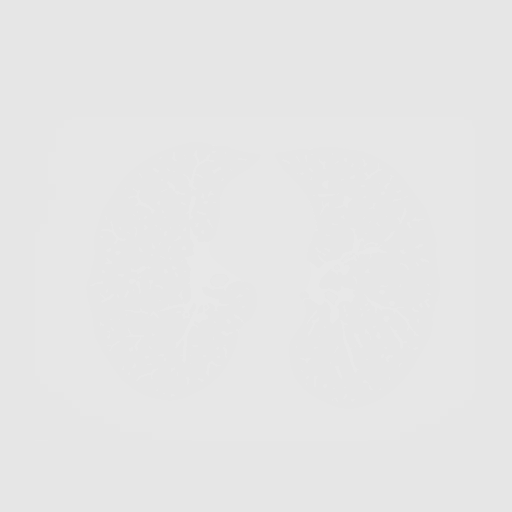
[frame 209/313  lung]
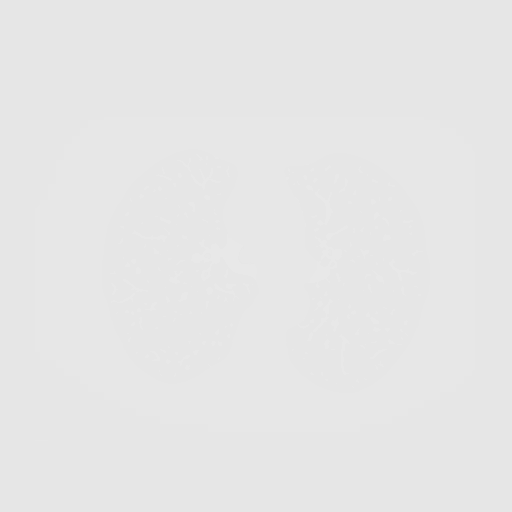
[frame 243/313  lung]
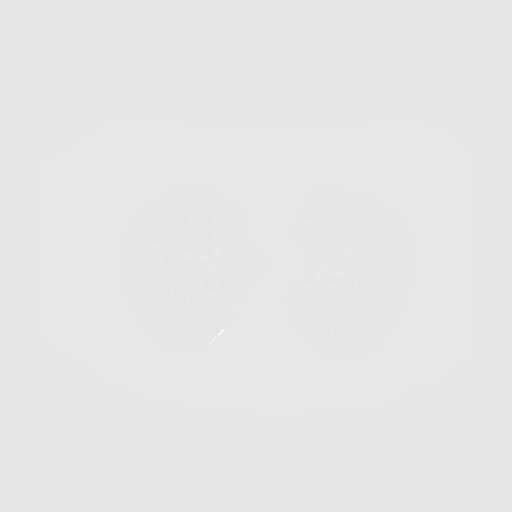
[frame 278/313  mediastinal]
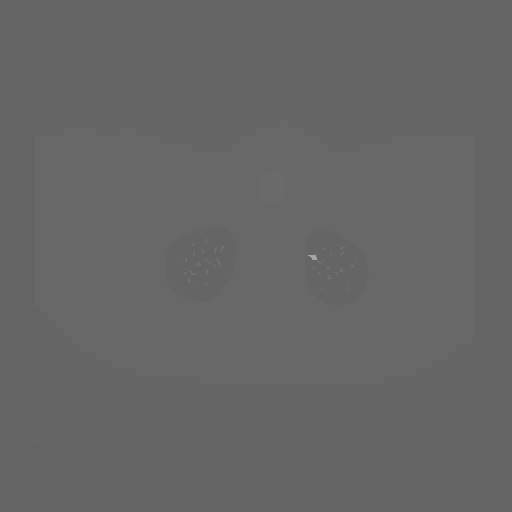
[frame 278/313  lung]
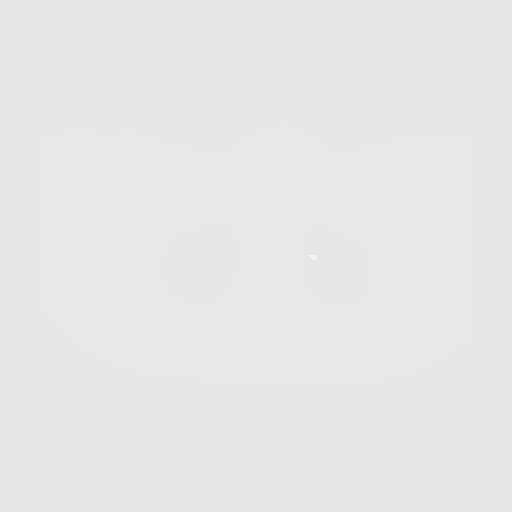
[frame 313/313  lung]
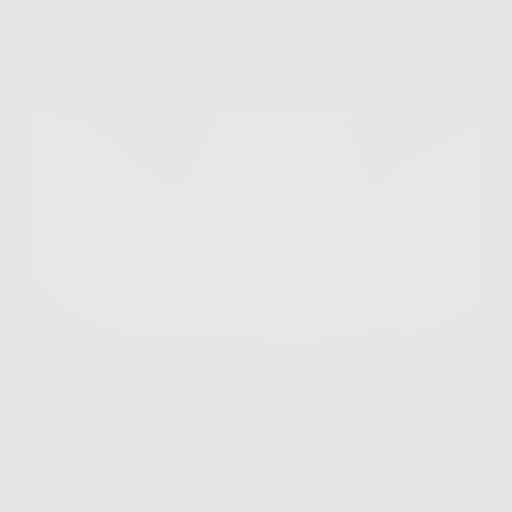

[10 of 30 positions shown; findings below may reference images not displayed]

FINDINGS: Cardiovascular: Heart size is normal. There is no significant
pericardial fluid, thickening or pericardial calcification. There is
aortic atherosclerosis, as well as atherosclerosis of the great
vessels of the mediastinum and the coronary arteries, including
calcified atherosclerotic plaque in the left main, left anterior
descending, left circumflex and right coronary arteries.

Mediastinum/Nodes: No pathologically enlarged mediastinal or hilar
lymph nodes. Please note that accurate exclusion of hilar adenopathy
is limited on noncontrast CT scans. Esophagus is unremarkable in
appearance. No axillary lymphadenopathy.

Lungs/Pleura: Small pulmonary nodule in the apex of the left upper
lobe with a volume derived mean diameter of only 2.7 mm (axial image
41 of series 3). No other larger more suspicious appearing pulmonary
nodules or masses are noted. There is no acute consolidative
airspace disease, and no pleural effusions. Mild bilateral apical
pleuroparenchymal thickening, most compatible with chronic post
infectious or inflammatory scarring. Mild diffuse bronchial wall
thickening with very mild centrilobular emphysema.

Upper Abdomen: Aortic atherosclerosis.

Musculoskeletal: There are no aggressive appearing lytic or blastic
lesions noted in the visualized portions of the skeleton.
IMPRESSION: 1. Lung-RADS Category 2S, benign appearance or behavior. Continue
annual screening with low-dose chest CT without contrast in 12
months.
2. The "S" modifier above refers to potentially clinically
significant non lung cancer related findings. Specifically, there is
aortic atherosclerosis, in addition to left main and 3 vessel
coronary artery disease. Please note that although the presence of
coronary artery calcium documents the presence of coronary artery
disease, the severity of this disease and any potential stenosis
cannot be assessed on this non-gated CT examination. Assessment for
potential risk factor modification, dietary therapy or pharmacologic
therapy may be warranted, if clinically indicated.
3. Mild diffuse bronchial wall thickening with very mild
centrilobular emphysema; imaging findings suggestive of underlying
COPD.

## 2018-01-19 ENCOUNTER — Ambulatory Visit (INDEPENDENT_AMBULATORY_CARE_PROVIDER_SITE_OTHER): Payer: Medicare Other | Admitting: Family Medicine

## 2018-01-19 ENCOUNTER — Encounter: Payer: Self-pay | Admitting: Family Medicine

## 2018-01-19 VITALS — BP 128/64 | HR 66 | Temp 97.0°F | Ht 71.0 in | Wt 114.8 lb

## 2018-01-19 DIAGNOSIS — M79661 Pain in right lower leg: Secondary | ICD-10-CM | POA: Diagnosis not present

## 2018-01-19 NOTE — Progress Notes (Signed)
   HPI  Patient presents today here for right leg pain.  Patient explains she is concerned about a blood clot.  She has had a right medial sharp intermittent pain in a line along her right calf between her calf muscle and tibia.  Patient states that happens mostly at rest, sometimes at night, sometimes while pushing on the gas pedal in her car.  She denies any significant leg swelling. She denies any history of blood clot.  She states that they were for the concern the pain is not that bad.  Patient also complains of some left ankle pain is been going on for years that she feels stems from an old sprained ankle when she was 72 years old  PMH: Smoking status noted ROS: Per HPI  Objective: BP 128/64   Pulse 66   Temp (!) 97 F (36.1 C) (Oral)   Ht 5\' 11"  (1.803 m)   Wt 114 lb 12.8 oz (52.1 kg)   BMI 16.01 kg/m  Gen: NAD, alert, cooperative with exam HEENT: NCAT CV: RRR, good S1/S2, no murmur Resp: CTABL, no wheezes, non-labored Ext: F circumference 29.5 cm in the left, 30.5 cm on the right, no calf tenderness to palpation, negative Homans sign, no erythema or warmth of the leg Neuro: Alert and oriented, No gross deficits  Assessment and plan:  #Right lower leg pain With negative Homans sign, and lack of erythema, warmth, swelling, and no tenderness to palpation of the right calf I feel that she is very unlikely to have a DVT I did offer an ultrasound but I do not believe it is necessary, she opts not to pursue this today. Her pain is most similar to a neuropathy, however she has no signs of radiculopathy, offered sports medicine referral with Teresa Coombs for further evaluation if she would like   Laroy Apple, MD [facility] 01/19/2018, 8:15 AM

## 2018-02-03 ENCOUNTER — Other Ambulatory Visit: Payer: Self-pay | Admitting: Family Medicine

## 2018-03-16 ENCOUNTER — Encounter: Payer: Self-pay | Admitting: Family

## 2018-03-16 ENCOUNTER — Ambulatory Visit (INDEPENDENT_AMBULATORY_CARE_PROVIDER_SITE_OTHER): Payer: Medicare Other | Admitting: Family

## 2018-03-16 VITALS — BP 121/70 | HR 73 | Temp 97.0°F | Ht 61.0 in | Wt 115.0 lb

## 2018-03-16 DIAGNOSIS — K219 Gastro-esophageal reflux disease without esophagitis: Secondary | ICD-10-CM

## 2018-03-16 DIAGNOSIS — I700 Atherosclerosis of aorta: Secondary | ICD-10-CM

## 2018-03-16 DIAGNOSIS — M810 Age-related osteoporosis without current pathological fracture: Secondary | ICD-10-CM | POA: Diagnosis not present

## 2018-03-16 DIAGNOSIS — E785 Hyperlipidemia, unspecified: Secondary | ICD-10-CM

## 2018-03-16 DIAGNOSIS — E039 Hypothyroidism, unspecified: Secondary | ICD-10-CM | POA: Diagnosis not present

## 2018-03-16 DIAGNOSIS — Z720 Tobacco use: Secondary | ICD-10-CM | POA: Diagnosis not present

## 2018-03-16 MED ORDER — LEVOTHYROXINE SODIUM 50 MCG PO TABS: 50.0000 ug | ORAL_TABLET | Freq: Every day | ORAL | 3 refills | Status: DC

## 2018-03-16 MED ORDER — SIMVASTATIN 40 MG PO TABS: ORAL_TABLET | ORAL | 1 refills | Status: DC

## 2018-03-16 NOTE — Progress Notes (Signed)
   Subjective:    Patient ID: Rhonda Kelly, female    DOB: [birth year], 72 y.o.   MRN: [id number]  Chief Complaint  Patient presents with  . Medical Management of Chronic Issues    Thyroid Problem  Presents for follow-up visit. Symptoms include dry skin and fatigue. Patient reports no constipation or depressed mood. The symptoms have been stable. Her past medical history is significant for hyperlipidemia.  Hyperlipidemia  This is a chronic problem. The current episode started more than 1 year ago. The problem is uncontrolled. Recent lipid tests were reviewed and are high. Current antihyperlipidemic treatment includes statins. The current treatment provides moderate improvement of lipids. Risk factors for coronary artery disease include dyslipidemia, hypertension, a sedentary lifestyle and post-menopausal.  Gastroesophageal Reflux  She reports no belching, no coughing or no heartburn. This is a chronic problem. The current episode started more than 1 year ago. The problem occurs rarely. The problem has been waxing and waning. The symptoms are aggravated by smoking. Associated symptoms include fatigue. She has tried an antacid for the symptoms. The treatment provided mild relief.  Osteoporosis  PT's last Dexa scan was 07/02/16. She is currently taking Prolia every 6 months.     Review of Systems  Constitutional: Positive for fatigue.  Respiratory: Negative for cough.   Gastrointestinal: Negative for constipation and heartburn.  All other systems reviewed and are negative.      Objective:   Physical Exam  Constitutional: She is oriented to person, place, and time. She appears well-developed and well-nourished. No distress.  HENT:  Head: Normocephalic and atraumatic.  Right Ear: External ear normal.  Left Ear: External ear normal.  Mouth/Throat: Oropharynx is clear and moist.  Eyes: Pupils are equal, round, and reactive to light.  Neck: Normal range of motion. Neck supple. No thyromegaly  present.  Cardiovascular: Normal rate, regular rhythm, normal heart sounds and intact distal pulses.  No murmur heard. Pulmonary/Chest: Effort normal and breath sounds normal. No respiratory distress. She has no wheezes.  Abdominal: Soft. Bowel sounds are normal. She exhibits no distension. There is no tenderness.  Musculoskeletal: Normal range of motion. She exhibits no edema or tenderness.  Neurological: She is alert and oriented to person, place, and time. She has normal reflexes. No cranial nerve deficit.  Skin: Skin is warm and dry.  Psychiatric: She has a normal mood and affect. Her behavior is normal. Judgment and thought content normal.  Vitals reviewed.    BP 121/70   Pulse 73   Temp (!) 97 F (36.1 C) (Oral)   Ht '5\' 1"'$  (1.549 m)   Wt 115 lb (52.2 kg)   BMI 21.73 kg/m      Assessment & Plan:  Rhonda Kelly comes in today with chief complaint of Medical Management of Chronic Issues   Diagnosis and orders addressed:  1. Aortic atherosclerosis (HCC) - CMP14+EGFR - CBC with Differential/Platelet  2. Gastroesophageal reflux disease, esophagitis presence not specified - CMP14+EGFR - CBC with Differential/Platelet  3. Hyperlipidemia, unspecified hyperlipidemia type - CMP14+EGFR - CBC with Differential/Platelet - Lipid panel  4. Hypothyroidism, unspecified type - CMP14+EGFR - CBC with Differential/Platelet - TSH  5. Tobacco abuse - CMP14+EGFR - CBC with Differential/Platelet  6. Osteoporosis, postmenopausal - CMP14+EGFR - CBC with Differential/Platelet   Labs pending Health Maintenance reviewed Diet and exercise encouraged  Follow up plan: 6 months   Evelina Dun, FNP

## 2018-03-16 NOTE — Patient Instructions (Signed)

## 2018-03-17 LAB — CMP14+EGFR
ALT: 25 IU/L (ref 0–32)
AST: 31 IU/L (ref 0–40)
Albumin/Globulin Ratio: 1.9 (ref 1.2–2.2)
Albumin: 4.2 g/dL (ref 3.5–4.8)
Alkaline Phosphatase: 76 IU/L (ref 39–117)
BUN/Creatinine Ratio: 18 (ref 12–28)
BUN: 14 mg/dL (ref 8–27)
Bilirubin Total: 0.2 mg/dL (ref 0.0–1.2)
CALCIUM: 9.7 mg/dL (ref 8.7–10.3)
CO2: 24 mmol/L (ref 20–29)
CREATININE: 0.76 mg/dL (ref 0.57–1.00)
Chloride: 102 mmol/L (ref 96–106)
GFR calc Af Amer: 92 mL/min/{1.73_m2} (ref >59)
GFR, EST NON AFRICAN AMERICAN: 80 mL/min/{1.73_m2} (ref >59)
GLUCOSE: 96 mg/dL (ref 65–99)
Globulin, Total: 2.2 g/dL (ref 1.5–4.5)
Potassium: 4.2 mmol/L (ref 3.5–5.2)
Sodium: 141 mmol/L (ref 134–144)
Total Protein: 6.4 g/dL (ref 6.0–8.5)

## 2018-03-17 LAB — CBC WITH DIFFERENTIAL/PLATELET
Basophils Absolute: 0.1 10*3/uL (ref 0.0–0.2)
Basos: 1 %
EOS (ABSOLUTE): 0.2 10*3/uL (ref 0.0–0.4)
Eos: 3 %
HEMATOCRIT: 44.1 % (ref 34.0–46.6)
Hemoglobin: 14.2 g/dL (ref 11.1–15.9)
IMMATURE GRANS (ABS): 0 10*3/uL (ref 0.0–0.1)
IMMATURE GRANULOCYTES: 0 %
Lymphocytes Absolute: 2.1 10*3/uL (ref 0.7–3.1)
Lymphs: 32 %
MCH: 28.2 pg (ref 26.6–33.0)
MCHC: 32.2 g/dL (ref 31.5–35.7)
MCV: 88 fL (ref 79–97)
Monocytes Absolute: 0.8 10*3/uL (ref 0.1–0.9)
Monocytes: 12 %
NEUTROS PCT: 52 %
Neutrophils Absolute: 3.5 10*3/uL (ref 1.4–7.0)
PLATELETS: 205 10*3/uL (ref 150–450)
RBC: 5.03 x10E6/uL (ref 3.77–5.28)
RDW: 13.6 % (ref 12.3–15.4)
WBC: 6.7 10*3/uL (ref 3.4–10.8)

## 2018-03-17 LAB — LIPID PANEL
Chol/HDL Ratio: 3.3 ratio (ref 0.0–4.4)
Cholesterol, Total: 192 mg/dL (ref 100–199)
HDL: 59 mg/dL (ref >39)
LDL Calculated: 114 mg/dL — ABNORMAL HIGH (ref 0–99)
TRIGLYCERIDES: 94 mg/dL (ref 0–149)
VLDL CHOLESTEROL CAL: 19 mg/dL (ref 5–40)

## 2018-03-17 LAB — TSH: TSH: 2.98 u[IU]/mL (ref 0.450–4.500)

## 2018-03-22 ENCOUNTER — Telehealth: Payer: Self-pay | Admitting: Family

## 2018-03-22 NOTE — Telephone Encounter (Signed)
Pt aware.

## 2018-05-10 ENCOUNTER — Ambulatory Visit (INDEPENDENT_AMBULATORY_CARE_PROVIDER_SITE_OTHER): Payer: Medicare Other

## 2018-05-10 DIAGNOSIS — Z23 Encounter for immunization: Secondary | ICD-10-CM

## 2018-05-14 ENCOUNTER — Ambulatory Visit (INDEPENDENT_AMBULATORY_CARE_PROVIDER_SITE_OTHER): Payer: Medicare Other | Admitting: *Deleted

## 2018-05-14 DIAGNOSIS — M810 Age-related osteoporosis without current pathological fracture: Secondary | ICD-10-CM

## 2018-05-14 MED ORDER — DENOSUMAB 60 MG/ML ~~LOC~~ SOSY
60.0000 mg | PREFILLED_SYRINGE | Freq: Once | SUBCUTANEOUS | Status: AC
  Administered 2018-05-14: 60 mg via SUBCUTANEOUS

## 2018-05-14 NOTE — Progress Notes (Signed)
Pt given Prolia inj Tolerated well Buy and bill 

## 2018-07-09 ENCOUNTER — Ambulatory Visit (INDEPENDENT_AMBULATORY_CARE_PROVIDER_SITE_OTHER): Payer: Medicare Other | Admitting: *Deleted

## 2018-07-09 ENCOUNTER — Encounter: Payer: Self-pay | Admitting: *Deleted

## 2018-07-09 ENCOUNTER — Ambulatory Visit (INDEPENDENT_AMBULATORY_CARE_PROVIDER_SITE_OTHER): Payer: Medicare Other

## 2018-07-09 VITALS — BP 140/66 | HR 59 | Ht 60.0 in | Wt 116.0 lb

## 2018-07-09 DIAGNOSIS — M810 Age-related osteoporosis without current pathological fracture: Secondary | ICD-10-CM

## 2018-07-09 DIAGNOSIS — Z23 Encounter for immunization: Secondary | ICD-10-CM | POA: Diagnosis not present

## 2018-07-09 MED ORDER — VARENICLINE TARTRATE 0.5 MG X 11 & 1 MG X 42 PO MISC: ORAL | 0 refills | Status: DC

## 2018-07-09 MED ORDER — VARENICLINE TARTRATE 1 MG PO TABS: 1.0000 mg | ORAL_TABLET | Freq: Two times a day (BID) | ORAL | 3 refills | Status: DC

## 2018-07-09 MED ORDER — NICOTINE POLACRILEX 2 MG MT GUM: 2.0000 mg | CHEWING_GUM | OROMUCOSAL | 2 refills | Status: DC | PRN

## 2018-07-09 NOTE — Progress Notes (Addendum)
Subjective:   Rhonda Kelly is a 73 y.o. female who presents for Medicare Annual (Subsequent) preventive examination.  Rhonda Kelly is retired from the [employer].  She enjoys doing yard work and reading.  She has been under increased stress over the past year as she has been helping taking care of her brother who had a stroke.  She states her brother passed away in 06/27/2018.  She lives at home with her husband, and small dog.  She has one daughter and 2 grand daughters.  Rhonda Kelly feels her health is unchanged from last year.  She reports no surgeries, ER visits, or hospitalizations in the past year.   Review of Systems:   All systems negative today       Objective:     Vitals: BP 140/66   Pulse (!) 59   Ht 5' (1.524 m)   Wt 116 lb (52.6 kg)   BMI 22.65 kg/m   Body mass index is 22.65 kg/m.  Advanced Directives 07/09/2018 07/08/2017 07/08/2017 06/12/2016 09/24/2015 02/05/2015  Does Patient Have a Medical Advance Directive? Yes - Yes Yes Yes Yes  Type of Paramedic of [place];Living will - [place];Living will [place];Living will Living will [place];Living will  Does patient want to make changes to medical advance directive? No - Patient declined - - No - Patient declined - No - Patient declined  Copy of [place] in Chart? No - copy requested No - copy requested No - copy requested No - copy requested - No - copy requested    Tobacco Social History   Tobacco Use  Smoking Status Current Every Day Smoker  . Packs/day: 0.75  . Years: 45.00  . Pack years: 33.75  . Types: Cigarettes  Smokeless Tobacco Never Used     Ready to quit: Yes Counseling given: Yes   Clinical Intake:     Pain Score: 0-No pain                 Past Medical History:  Diagnosis Date  . Allergy    some seasonal- otc meds prn only  . Bladder prolapse, female, acquired    surgery pending to  repair 09-24-15  . Cataract    small  . Colon polyp   . GERD (gastroesophageal reflux disease)   . Hyperlipidemia   . Macular degeneration   . Osteoporosis   . Thyroid disease    25 years ago- no meds now    Past Surgical History:  Procedure Laterality Date  . ABDOMINAL HYSTERECTOMY Bilateral 1993   leiomyomata, bleeding  . COLONOSCOPY    . [place]   right  . OOPHORECTOMY     BSO  . POLYPECTOMY    . TONSILLECTOMY  1972  . TUBAL LIGATION     Family History  Problem Relation Age of Onset  . Colon cancer Mother 95  . Heart attack Mother        during gall bladder surgery  . Heart disease Mother   . Colon cancer Maternal Grandmother 77  . Cancer Father        prostate  . Diabetes Sister   . Colon polyps Sister   . Stroke Brother   . Heart attack Brother   . CAD Brother   . Heart attack Brother   . CAD Brother   . Diabetes Brother   . Colon cancer Maternal Aunt   .  Colon cancer Maternal Uncle   . Stomach cancer Neg Hx    Social History   Socioeconomic History  . Marital status: Married    Spouse name: Not on file  . Number of children: 1  . Years of education: Not on file  . Highest education level: Associate degree: academic program  Occupational History  . Occupation: Retired    Fish farm manager: [employer]    Comment: Retired in 2009  Social Needs  . Financial resource strain: Not hard at all  . Food insecurity:    Worry: Never true    Inability: Never true  . Transportation needs:    Medical: No    Non-medical: No  Tobacco Use  . Smoking status: Current Every Day Smoker    Packs/day: 0.75    Years: 45.00    Pack years: 33.75    Types: Cigarettes  . Smokeless tobacco: Never Used  Substance and Sexual Activity  . Alcohol use: Yes    Alcohol/week: 0.0 standard drinks    Comment: rare - 3 times per year  . Drug use: No  . Sexual activity: Not on file    Comment: 1st intercourse 73 yo-Fewer than 5 partners  Lifestyle  . Physical activity:    Days  per week: 0 days    Minutes per session: 0 min  . Stress: Not at all  Relationships  . Social connections:    Talks on phone: More than three times a week    Gets together: More than three times a week    Attends religious service: More than 4 times per year    Active member of club or organization: No    Attends meetings of clubs or organizations: Never    Relationship status: Married  Other Topics Concern  . Not on file  Social History Narrative  . Not on file    Outpatient Encounter Medications as of 07/09/2018  Medication Sig  . aspirin 81 MG tablet Take 81 mg by mouth daily.  . Cholecalciferol 1000 units TBDP Take 1,000 Int'l Units by mouth daily.  Marland Kitchen Co-Enzyme Q-10 100 MG CAPS Take 100 mg by mouth daily.  Marland Kitchen denosumab (PROLIA) 60 MG/ML SOLN injection Inject 60 mg into the skin every 6 (six) months. Administer in upper arm, thigh, or abdomen  . KRILL OIL PO Take 1 capsule by mouth daily.  Marland Kitchen levothyroxine (SYNTHROID, LEVOTHROID) 50 MCG tablet Take 1 tablet (50 mcg total) by mouth daily.  . multivitamin-lutein (OCUVITE-LUTEIN) CAPS capsule Take 1 capsule by mouth daily. icaps Areds 2  . simvastatin (ZOCOR) 40 MG tablet TAKE ONE TABLET DAILY AT BEDTIME  . nicotine polacrilex (NICORETTE) 2 MG gum Take 1 each (2 mg total) by mouth as needed for smoking cessation.  . varenicline (CHANTIX CONTINUING MONTH PAK) 1 MG tablet Take 1 tablet (1 mg total) by mouth 2 (two) times daily.  . varenicline (CHANTIX STARTING MONTH PAK) 0.5 MG X 11 & 1 MG X 42 tablet Take one 0.5 mg tablet by mouth once daily for 3 days, then increase to one 0.5 mg tablet twice daily for 4 days, then increase to one 1 mg tablet twice daily.  . [DISCONTINUED] varenicline (CHANTIX CONTINUING MONTH PAK) 1 MG tablet Take 1 tablet (1 mg total) by mouth 2 (two) times daily. (Patient not taking: Reported on 01/19/2018)  . [DISCONTINUED] varenicline (CHANTIX STARTING MONTH PAK) 0.5 MG X 11 & 1 MG X 42 tablet Take one 0.5 mg tablet  by mouth once daily  for 3 days, then increase to one 0.5 mg tablet twice daily for 4 days, then increase to one 1 mg tablet twice daily. (Patient not taking: Reported on 01/19/2018)   No facility-administered encounter medications on file as of 07/09/2018.     Activities of Daily Living In your present state of health, do you have any difficulty performing the following activities: 07/09/2018  Hearing? N  Vision? N  Difficulty concentrating or making decisions? N  Walking or climbing stairs? N  Dressing or bathing? N  Doing errands, shopping? N  Preparing Food and eating ? N  Using the Toilet? N  In the past six months, have you accidently leaked urine? N  Do you have problems with loss of bowel control? N  Managing your Medications? N  Managing your Finances? N  Housekeeping or managing your Housekeeping? N  Some recent data might be hidden    Patient Care Team: Sharion Balloon, FNP as PCP - General (Family Medicine) Harlen Labs, MD as Referring Physician (Optometry) Irene Shipper, MD as Consulting Physician (Gastroenterology)    Assessment:   This is a routine wellness examination for [place].  Exercise Activities and Dietary recommendations    Mrs. Madera states she eats 2 meals per day and snacks as needed.  She describes a diet of mostly fruits, vegetables, whole grains, and lean proteins.  She states she has access to all the food she needs.   Mrs. Horst does not participate in regular exercise, but she states she stays active.   Goals    . DIET - EAT MORE FRUITS      Add at least 1 serving of fruit to diet per day.    . Quit Smoking     Use chantix and Nicorette to help quit smoking.         Fall Risk Fall Risk  03/16/2018 01/19/2018 09/07/2017 07/08/2017 04/20/2017  Falls in the past year? No No No No No   Is the patient's home free of loose throw rugs in walkways, pet beds, electrical cords, etc?   yes      Grab bars in the bathroom? yes      Handrails on the  stairs?  yes      Adequate lighting?   yes    Depression Screen PHQ 2/9 Scores 03/16/2018 01/19/2018 09/07/2017 07/08/2017  PHQ - 2 Score 0 0 0 0     Cognitive Function MMSE - Mini Mental State Exam 07/08/2017 02/05/2015  Orientation to time 5 5  Orientation to Place 5 5  Registration 3 3  Attention/ Calculation 5 5  Recall 3 3  Language- name 2 objects 2 2  Language- repeat 1 1  Language- follow 3 step command 3 3  Language- read & follow direction 1 1  Write a sentence 1 1  Copy design 1 1  Total score 30 30     6CIT Screen 06/12/2016  What Year? 0 points  What month? 0 points  What time? 0 points  Count back from 20 0 points  Months in reverse 0 points  Repeat phrase 0 points  Total Score 0    Immunization History  Administered Date(s) Administered  . Influenza, High Dose Seasonal PF 06/12/2016, 04/20/2017, 05/10/2018  . Influenza,inj,Quad PF,6+ Mos 05/03/2014, 04/10/2015  . Pneumococcal Conjugate-13 05/03/2014  . Pneumococcal Polysaccharide-23 07/31/2015  . Zoster 02/05/2015  . Zoster Recombinat (Shingrix) 07/08/2017, 07/09/2018    Qualifies for Shingles Vaccine? Yes, series completed today  Screening Tests Health Maintenance  Topic Date Due  . TETANUS/TDAP  03/06/1967  . DEXA SCAN  07/02/2018  . COLONOSCOPY  10/08/2018  . MAMMOGRAM  10/28/2018  . INFLUENZA VACCINE  Completed  . Hepatitis C Screening  Completed  . PNA vac Low Risk Adult  Completed   Tdap declined today     Cancer Screenings: Lung: Low Dose CT Chest recommended if Age 44-80 years, 30 pack-year currently smoking OR have quit w/in 15years. Patient does qualify. Breast:  Up to date on Mammogram? No, scheduled for 07/28/18 Up to date of Bone Density/Dexa? No, completed today Colorectal: up to date, due 09/2018  Additional Screenings:  Hepatitis C Screening:   Completed 02/05/15     Plan:     Work on your goal of quitting smoking.  Chantix has been sent into [facility].  Please bring  a copy of your Living Will and [place] to our office to be filed in your medical record.  Have mammogram done 07/28/2018. Continue the good work with your health diet habits.    I have personally reviewed and noted the following in the patient's chart:   . Medical and social history . Use of alcohol, tobacco or illicit drugs  . Current medications and supplements . Functional ability and status . Nutritional status . Physical activity . Advanced directives . List of other physicians . Hospitalizations, surgeries, and ER visits in previous 12 months . Vitals . Screenings to include cognitive, depression, and falls . Referrals and appointments  In addition, I have reviewed and discussed with patient certain preventive protocols, quality metrics, and best practice recommendations. A written personalized care plan for preventive services as well as general preventive health recommendations were provided to patient.     Marleni Gallardo M, RN  07/09/2018  I have reviewed and agree with the above AWV documentation.   Evelina Dun, FNP

## 2018-07-09 NOTE — Patient Instructions (Addendum)
Please work on your goal of quitting smoking.  Chantix has been sent into [facility].   At your convenience, please bring a copy of your Living Will and [place] to our office to be filed in your medical record.   You have been scheduled for a mammogram on 07/28/17 at 9:40 am.  Continue the good work with your health diet habits.  Thank you for coming in for your Annual Wellness Visit today!  Preventive Care 18 Years and Older, Female Preventive care refers to lifestyle choices and visits with your health care provider that can promote health and wellness. What does preventive care include?  A yearly physical exam. This is also called an annual well check.  Dental exams once or twice a year.  Routine eye exams. Ask your health care provider how often you should have your eyes checked.  Personal lifestyle choices, including: ? Daily care of your teeth and gums. ? Regular physical activity. ? Eating a healthy diet. ? Avoiding tobacco and drug use. ? Limiting alcohol use. ? Practicing safe sex. ? Taking low-dose aspirin every day. ? Taking vitamin and mineral supplements as recommended by your health care provider. What happens during an annual well check? The services and screenings done by your health care provider during your annual well check will depend on your age, overall health, lifestyle risk factors, and family history of disease. Counseling Your health care provider may ask you questions about your:  Alcohol use.  Tobacco use.  Drug use.  Emotional well-being.  Home and relationship well-being.  Sexual activity.  Eating habits.  History of falls.  Memory and ability to understand (cognition).  Work and work Statistician.  Reproductive health.  Screening You may have the following tests or measurements:  Height, weight, and BMI.  Blood pressure.  Lipid and cholesterol levels. These may be checked every 5 years, or more  frequently if you are over 55 years old.  Skin check.  Lung cancer screening. You may have this screening every year starting at age 17 if you have a 30-pack-year history of smoking and currently smoke or have quit within the past 15 years.  Colorectal cancer screening. All adults should have this screening starting at age 64 and continuing until age 73. You will have tests every 1-10 years, depending on your results and the type of screening test. People at increased risk should start screening at an earlier age. Screening tests may include: ? Guaiac-based fecal occult blood testing. ? Fecal immunochemical test (FIT). ? Stool DNA test. ? Virtual colonoscopy. ? Sigmoidoscopy. During this test, a flexible tube with a tiny camera (sigmoidoscope) is used to examine your rectum and lower colon. The sigmoidoscope is inserted through your anus into your rectum and lower colon. ? Colonoscopy. During this test, a long, thin, flexible tube with a tiny camera (colonoscope) is used to examine your entire colon and rectum.  Hepatitis C blood test.  Hepatitis B blood test.  Sexually transmitted disease (STD) testing.  Diabetes screening. This is done by checking your blood sugar (glucose) after you have not eaten for a while (fasting). You may have this done every 1-3 years.  Bone density scan. This is done to screen for osteoporosis. You may have this done starting at age 40.  Mammogram. This may be done every 1-2 years. Talk to your health care provider about how often you should have regular mammograms. Talk with your health care provider about your test  results, treatment options, and if necessary, the need for more tests. Vaccines Your health care provider may recommend certain vaccines, such as:  Influenza vaccine. This is recommended every year.  Tetanus, diphtheria, and acellular pertussis (Tdap, Td) vaccine. You may need a Td booster every 10 years.  Varicella vaccine. You may need this  if you have not been vaccinated.  Zoster vaccine. You may need this after age 81.  Measles, mumps, and rubella (MMR) vaccine. You may need at least one dose of MMR if you were born in 1957 or later. You may also need a second dose.  Pneumococcal 13-valent conjugate (PCV13) vaccine. One dose is recommended after age 56.  Pneumococcal polysaccharide (PPSV23) vaccine. One dose is recommended after age 72.  Meningococcal vaccine. You may need this if you have certain conditions.  Hepatitis A vaccine. You may need this if you have certain conditions or if you travel or work in places where you may be exposed to hepatitis A.  Hepatitis B vaccine. You may need this if you have certain conditions or if you travel or work in places where you may be exposed to hepatitis B.  Haemophilus influenzae type b (Hib) vaccine. You may need this if you have certain conditions. Talk to your health care provider about which screenings and vaccines you need and how often you need them. This information is not intended to replace advice given to you by your health care provider. Make sure you discuss any questions you have with your health care provider. Document Released: 07/13/2015 Document Revised: 08/06/2017 Document Reviewed: 04/17/2015 Elsevier Interactive Patient Education  2019 [employer].

## 2018-07-28 DIAGNOSIS — Z1231 Encounter for screening mammogram for malignant neoplasm of breast: Secondary | ICD-10-CM | POA: Diagnosis not present

## 2018-07-28 LAB — HM MAMMOGRAPHY

## 2018-09-14 ENCOUNTER — Other Ambulatory Visit: Payer: Self-pay

## 2018-09-14 ENCOUNTER — Ambulatory Visit (INDEPENDENT_AMBULATORY_CARE_PROVIDER_SITE_OTHER): Payer: Medicare Other | Admitting: Family

## 2018-09-14 ENCOUNTER — Encounter: Payer: Self-pay | Admitting: Family

## 2018-09-14 VITALS — BP 143/70 | HR 64 | Temp 96.9°F | Ht 60.0 in | Wt 118.4 lb

## 2018-09-14 DIAGNOSIS — K219 Gastro-esophageal reflux disease without esophagitis: Secondary | ICD-10-CM | POA: Diagnosis not present

## 2018-09-14 DIAGNOSIS — I700 Atherosclerosis of aorta: Secondary | ICD-10-CM

## 2018-09-14 DIAGNOSIS — E785 Hyperlipidemia, unspecified: Secondary | ICD-10-CM | POA: Diagnosis not present

## 2018-09-14 DIAGNOSIS — Z720 Tobacco use: Secondary | ICD-10-CM | POA: Diagnosis not present

## 2018-09-14 DIAGNOSIS — E039 Hypothyroidism, unspecified: Secondary | ICD-10-CM | POA: Diagnosis not present

## 2018-09-14 MED ORDER — LEVOTHYROXINE SODIUM 50 MCG PO TABS: 50.0000 ug | ORAL_TABLET | Freq: Every day | ORAL | 3 refills | Status: DC

## 2018-09-14 MED ORDER — SIMVASTATIN 40 MG PO TABS: ORAL_TABLET | ORAL | 1 refills | Status: DC

## 2018-09-14 MED ORDER — TOBRAMYCIN-DEXAMETHASONE 0.3-0.1 % OP SUSP: 2.0000 [drp] | Freq: Four times a day (QID) | OPHTHALMIC | 1 refills | Status: DC

## 2018-09-14 NOTE — Progress Notes (Signed)
Subjective:    Patient ID: Rhonda Kelly, female    DOB: 1946-05-24, 73 y.o.   MRN: [id number]  Chief Complaint  Patient presents with  . Medical Management of Chronic Issues    six month check up   PT presents to the office today chronic follow up.  Gastroesophageal Reflux  She complains of belching and heartburn. She reports no choking or no coughing. This is a chronic problem. The current episode started more than 1 year ago. The problem occurs rarely. The symptoms are aggravated by certain foods. Pertinent negatives include no fatigue. She has tried an antacid for the symptoms. The treatment provided moderate relief.  Hypertension  This is a chronic problem. The current episode started more than 1 year ago. The problem has been waxing and waning since onset. The problem is uncontrolled. Pertinent negatives include no malaise/fatigue, peripheral edema or shortness of breath. Risk factors for coronary artery disease include dyslipidemia, sedentary lifestyle and smoking/tobacco exposure. The current treatment provides moderate improvement. Identifiable causes of hypertension include a thyroid problem.  Hyperlipidemia  This is a chronic problem. The current episode started more than 1 year ago. The problem is uncontrolled. Recent lipid tests were reviewed and are high. Pertinent negatives include no shortness of breath. Current antihyperlipidemic treatment includes statins. The current treatment provides moderate improvement of lipids. Risk factors for coronary artery disease include dyslipidemia, hypertension, a sedentary lifestyle and post-menopausal.  Thyroid Problem  Presents for follow-up visit. Symptoms include dry skin. Patient reports no constipation, depressed mood or fatigue. The symptoms have been stable. Her past medical history is significant for hyperlipidemia.  Osteoporosis  Last Dexascan was 07/09/18. Takes Prolia every 6 months.     Review of Systems  Constitutional: Negative  for fatigue and malaise/fatigue.  Respiratory: Negative for cough, choking and shortness of breath.   Gastrointestinal: Positive for heartburn. Negative for constipation.  All other systems reviewed and are negative.      Objective:   Physical Exam Vitals signs reviewed.  Constitutional:      General: She is not in acute distress.    Appearance: She is well-developed.  HENT:     Head: Normocephalic and atraumatic.     Right Ear: Tympanic membrane normal.     Left Ear: Tympanic membrane normal.  Eyes:     Pupils: Pupils are equal, round, and reactive to light.  Neck:     Musculoskeletal: Normal range of motion and neck supple.     Thyroid: No thyromegaly.  Cardiovascular:     Rate and Rhythm: Normal rate and regular rhythm.     Heart sounds: Normal heart sounds. No murmur.  Pulmonary:     Effort: Pulmonary effort is normal. No respiratory distress.     Breath sounds: Normal breath sounds. No wheezing.  Abdominal:     General: Bowel sounds are normal. There is no distension.     Palpations: Abdomen is soft.     Tenderness: There is no abdominal tenderness.  Musculoskeletal: Normal range of motion.        General: No tenderness.  Skin:    General: Skin is warm and dry.  Neurological:     Mental Status: She is alert and oriented to person, place, and time.     Cranial Nerves: No cranial nerve deficit.     Deep Tendon Reflexes: Reflexes are normal and symmetric.  Psychiatric:        Behavior: Behavior normal.        Thought  Content: Thought content normal.        Judgment: Judgment normal.       BP (!) 143/70   Pulse 64   Temp (!) 96.9 F (36.1 C) (Oral)   Ht 5' (1.524 m)   Wt 118 lb 6.4 oz (53.7 kg)   BMI 23.12 kg/m      Assessment & Plan:  Rhonda Kelly comes in today with chief complaint of Medical Management of Chronic Issues (six month check up)   Diagnosis and orders addressed:  1. Hypothyroidism, unspecified type - CMP14+EGFR - TSH - CBC with  Differential/Platelet - levothyroxine (SYNTHROID, LEVOTHROID) 50 MCG tablet; Take 1 tablet (50 mcg total) by mouth daily.  Dispense: 90 tablet; Refill: 3  2. Aortic atherosclerosis (HCC) - CMP14+EGFR - CBC with Differential/Platelet  3. Gastroesophageal reflux disease, esophagitis presence not specified - CMP14+EGFR - CBC with Differential/Platelet  4. Hyperlipidemia, unspecified hyperlipidemia type - CMP14+EGFR - Lipid panel - CBC with Differential/Platelet - simvastatin (ZOCOR) 40 MG tablet; TAKE ONE TABLET DAILY AT BEDTIME  Dispense: 90 tablet; Refill: 1  5. Tobacco abuse - CMP14+EGFR - CBC with Differential/Platelet   Labs pending Health Maintenance reviewed Diet and exercise encouraged  Follow up plan: 6 months    Evelina Dun, FNP

## 2018-09-14 NOTE — Patient Instructions (Signed)
Hypothyroidism  Hypothyroidism is when the thyroid gland does not make enough of certain hormones (it is underactive). The thyroid gland is a small gland located in the lower front part of the neck, just in front of the windpipe (trachea). This gland makes hormones that help control how the body uses food for energy (metabolism) as well as how the heart and brain function. These hormones also play a role in keeping your bones strong. When the thyroid is underactive, it produces too little of the hormones thyroxine (T4) and triiodothyronine (T3). What are the causes? This condition may be caused by:  Hashimoto's disease. This is a disease in which the body's disease-fighting system (immune system) attacks the thyroid gland. This is the most common cause.  Viral infections.  Pregnancy.  Certain medicines.  Birth defects.  Past radiation treatments to the head or neck for cancer.  Past treatment with radioactive iodine.  Past exposure to radiation in the environment.  Past surgical removal of part or all of the thyroid.  Problems with a gland in the center of the brain (pituitary gland).  Lack of enough iodine in the diet. What increases the risk? You are more likely to develop this condition if:  You are female.  You have a family history of thyroid conditions.  You use a medicine called lithium.  You take medicines that affect the immune system (immunosuppressants). What are the signs or symptoms? Symptoms of this condition include:  Feeling as though you have no energy (lethargy).  Not being able to tolerate cold.  Weight gain that is not explained by a change in diet or exercise habits.  Lack of appetite.  Dry skin.  Coarse hair.  Menstrual irregularity.  Slowing of thought processes.  Constipation.  Sadness or depression. How is this diagnosed? This condition may be diagnosed based on:  Your symptoms, your medical history, and a physical exam.  Blood  tests. You may also have imaging tests, such as an ultrasound or MRI. How is this treated? This condition is treated with medicine that replaces the thyroid hormones that your body does not make. After you begin treatment, it may take several weeks for symptoms to go away. Follow these instructions at home:  Take over-the-counter and prescription medicines only as told by your health care provider.  If you start taking any new medicines, tell your health care provider.  Keep all follow-up visits as told by your health care provider. This is important. ? As your condition improves, your dosage of thyroid hormone medicine may change. ? You will need to have blood tests regularly so that your health care provider can monitor your condition. Contact a health care provider if:  Your symptoms do not get better with treatment.  You are taking thyroid replacement medicine and you: ? Sweat a lot. ? Have tremors. ? Feel anxious. ? Lose weight rapidly. ? Cannot tolerate heat. ? Have emotional swings. ? Have diarrhea. ? Feel weak. Get help right away if you have:  Chest pain.  An irregular heartbeat.  A rapid heartbeat.  Difficulty breathing. Summary  Hypothyroidism is when the thyroid gland does not make enough of certain hormones (it is underactive).  When the thyroid is underactive, it produces too little of the hormones thyroxine (T4) and triiodothyronine (T3).  The most common cause is Hashimoto's disease, a disease in which the body's disease-fighting system (immune system) attacks the thyroid gland. The condition can also be caused by viral infections, medicine, pregnancy, or past   radiation treatment to the head or neck.  Symptoms may include weight gain, dry skin, constipation, feeling as though you do not have energy, and not being able to tolerate cold.  This condition is treated with medicine to replace the thyroid hormones that your body does not make. This information  is not intended to replace advice given to you by your health care provider. Make sure you discuss any questions you have with your health care provider. Document Released: 06/16/2005 Document Revised: 05/27/2017 Document Reviewed: 05/27/2017 Elsevier Interactive Patient Education  2019 Elsevier Inc.  

## 2018-09-15 LAB — CBC WITH DIFFERENTIAL/PLATELET
Basophils Absolute: 0 10*3/uL (ref 0.0–0.2)
Basos: 1 %
EOS (ABSOLUTE): 0.2 10*3/uL (ref 0.0–0.4)
Eos: 3 %
Hematocrit: 43 % (ref 34.0–46.6)
Hemoglobin: 14.4 g/dL (ref 11.1–15.9)
Immature Grans (Abs): 0 10*3/uL (ref 0.0–0.1)
Immature Granulocytes: 0 %
Lymphocytes Absolute: 2.8 10*3/uL (ref 0.7–3.1)
Lymphs: 37 %
MCH: 29.4 pg (ref 26.6–33.0)
MCHC: 33.5 g/dL (ref 31.5–35.7)
MCV: 88 fL (ref 79–97)
Monocytes Absolute: 0.7 10*3/uL (ref 0.1–0.9)
Monocytes: 9 %
Neutrophils Absolute: 3.7 10*3/uL (ref 1.4–7.0)
Neutrophils: 50 %
Platelets: 215 10*3/uL (ref 150–450)
RBC: 4.9 x10E6/uL (ref 3.77–5.28)
RDW: 13.3 % (ref 11.7–15.4)
WBC: 7.4 10*3/uL (ref 3.4–10.8)

## 2018-09-15 LAB — CMP14+EGFR
ALT: 19 IU/L (ref 0–32)
AST: 27 IU/L (ref 0–40)
Albumin/Globulin Ratio: 2.4 — ABNORMAL HIGH (ref 1.2–2.2)
Albumin: 4.6 g/dL (ref 3.8–4.8)
Alkaline Phosphatase: 71 IU/L (ref 39–117)
BUN/Creatinine Ratio: 14 (ref 12–28)
BUN: 9 mg/dL (ref 8–27)
Bilirubin Total: 0.3 mg/dL (ref 0.0–1.2)
CO2: 19 mmol/L — AB (ref 20–29)
CREATININE: 0.66 mg/dL (ref 0.57–1.00)
Calcium: 9 mg/dL (ref 8.7–10.3)
Chloride: 108 mmol/L — ABNORMAL HIGH (ref 96–106)
GFR calc Af Amer: 104 mL/min/{1.73_m2} (ref >59)
GFR calc non Af Amer: 90 mL/min/{1.73_m2} (ref >59)
GLUCOSE: 92 mg/dL (ref 65–99)
Globulin, Total: 1.9 g/dL (ref 1.5–4.5)
Potassium: 4.3 mmol/L (ref 3.5–5.2)
Sodium: 146 mmol/L — ABNORMAL HIGH (ref 134–144)
Total Protein: 6.5 g/dL (ref 6.0–8.5)

## 2018-09-15 LAB — LIPID PANEL
Chol/HDL Ratio: 3.2 ratio (ref 0.0–4.4)
Cholesterol, Total: 206 mg/dL — ABNORMAL HIGH (ref 100–199)
HDL: 65 mg/dL (ref >39)
LDL Calculated: 117 mg/dL — ABNORMAL HIGH (ref 0–99)
Triglycerides: 120 mg/dL (ref 0–149)
VLDL Cholesterol Cal: 24 mg/dL (ref 5–40)

## 2018-09-15 LAB — TSH: TSH: 4.24 u[IU]/mL (ref 0.450–4.500)

## 2018-09-17 ENCOUNTER — Other Ambulatory Visit: Payer: Self-pay | Admitting: Family

## 2018-09-17 MED ORDER — ATORVASTATIN CALCIUM 20 MG PO TABS: 20.0000 mg | ORAL_TABLET | Freq: Every day | ORAL | 3 refills | Status: DC

## 2018-11-08 ENCOUNTER — Encounter: Payer: Self-pay | Admitting: Internal Medicine

## 2019-04-06 ENCOUNTER — Encounter: Payer: Self-pay | Admitting: Gynecology

## 2019-04-13 ENCOUNTER — Telehealth: Payer: Self-pay | Admitting: Family

## 2019-04-13 NOTE — Telephone Encounter (Signed)
Patient aware last prolia was 05/14/2018

## 2019-04-21 DIAGNOSIS — Z23 Encounter for immunization: Secondary | ICD-10-CM | POA: Diagnosis not present

## 2019-04-28 ENCOUNTER — Ambulatory Visit: Payer: Medicare Other

## 2019-07-04 ENCOUNTER — Other Ambulatory Visit: Payer: Self-pay | Admitting: Family

## 2019-07-12 ENCOUNTER — Ambulatory Visit (INDEPENDENT_AMBULATORY_CARE_PROVIDER_SITE_OTHER): Payer: Medicare Other | Admitting: *Deleted

## 2019-07-12 DIAGNOSIS — Z0000 Encounter for general adult medical examination without abnormal findings: Secondary | ICD-10-CM

## 2019-07-12 NOTE — Patient Instructions (Signed)
Preventive Care 72 Years and Older, Female Preventive care refers to lifestyle choices and visits with your health care provider that can promote health and wellness. This includes:  A yearly physical exam. This is also called an annual well check.  Regular dental and eye exams.  Immunizations.  Screening for certain conditions.  Healthy lifestyle choices, such as diet and exercise. What can I expect for my preventive care visit? Physical exam Your health care provider will check:  Height and weight. These may be used to calculate body mass index (BMI), which is a measurement that tells if you are at a healthy weight.  Heart rate and blood pressure.  Your skin for abnormal spots. Counseling Your health care provider may ask you questions about:  Alcohol, tobacco, and drug use.  Emotional well-being.  Home and relationship well-being.  Sexual activity.  Eating habits.  History of falls.  Memory and ability to understand (cognition).  Work and work Statistician.  Pregnancy and menstrual history. What immunizations do I need?  Influenza (flu) vaccine  This is recommended every year. Tetanus, diphtheria, and pertussis (Tdap) vaccine  You may need a Td booster every 10 years. Varicella (chickenpox) vaccine  You may need this vaccine if you have not already been vaccinated. Zoster (shingles) vaccine  You may need this after age 33. Pneumococcal conjugate (PCV13) vaccine  One dose is recommended after age 33. Pneumococcal polysaccharide (PPSV23) vaccine  One dose is recommended after age 72. Measles, mumps, and rubella (MMR) vaccine  You may need at least one dose of MMR if you were born in 1957 or later. You may also need a second dose. Meningococcal conjugate (MenACWY) vaccine  You may need this if you have certain conditions. Hepatitis A vaccine  You may need this if you have certain conditions or if you travel or work in places where you may be exposed  to hepatitis A. Hepatitis B vaccine  You may need this if you have certain conditions or if you travel or work in places where you may be exposed to hepatitis B. Haemophilus influenzae type b (Hib) vaccine  You may need this if you have certain conditions. You may receive vaccines as individual doses or as more than one vaccine together in one shot (combination vaccines). Talk with your health care provider about the risks and benefits of combination vaccines. What tests do I need? Blood tests  Lipid and cholesterol levels. These may be checked every 5 years, or more frequently depending on your overall health.  Hepatitis C test.  Hepatitis B test. Screening  Lung cancer screening. You may have this screening every year starting at age 39 if you have a 30-pack-year history of smoking and currently smoke or have quit within the past 15 years.  Colorectal cancer screening. All adults should have this screening starting at age 36 and continuing until age 15. Your health care provider may recommend screening at age 23 if you are at increased risk. You will have tests every 1-10 years, depending on your results and the type of screening test.  Diabetes screening. This is done by checking your blood sugar (glucose) after you have not eaten for a while (fasting). You may have this done every 1-3 years.  Mammogram. This may be done every 1-2 years. Talk with your health care provider about how often you should have regular mammograms.  BRCA-related cancer screening. This may be done if you have a family history of breast, ovarian, tubal, or peritoneal cancers.  Other tests  Sexually transmitted disease (STD) testing.  Bone density scan. This is done to screen for osteoporosis. You may have this done starting at age 44. Follow these instructions at home: Eating and drinking  Eat a diet that includes fresh fruits and vegetables, whole grains, lean protein, and low-fat dairy products. Limit  your intake of foods with high amounts of sugar, saturated fats, and salt.  Take vitamin and mineral supplements as recommended by your health care provider.  Do not drink alcohol if your health care provider tells you not to drink.  If you drink alcohol: ? Limit how much you have to 0-1 drink a day. ? Be aware of how much alcohol is in your drink. In the [place] one drink equals one 12 oz bottle of beer (355 mL), one 5 oz glass of wine (148 mL), or one 1 oz glass of hard liquor (44 mL). Lifestyle  Take daily care of your teeth and gums.  Stay active. Exercise for at least 30 minutes on 5 or more days each week.  Do not use any products that contain nicotine or tobacco, such as cigarettes, e-cigarettes, and chewing tobacco. If you need help quitting, ask your health care provider.  If you are sexually active, practice safe sex. Use a condom or other form of protection in order to prevent STIs (sexually transmitted infections).  Talk with your health care provider about taking a low-dose aspirin or statin. What's next?  Go to your health care provider once a year for a well check visit.  Ask your health care provider how often you should have your eyes and teeth checked.  Stay up to date on all vaccines. This information is not intended to replace advice given to you by your health care provider. Make sure you discuss any questions you have with your health care provider. Document Revised: 06/10/2018 Document Reviewed: 06/10/2018 Elsevier Patient Education  2020 [employer].

## 2019-07-12 NOTE — Progress Notes (Addendum)
MEDICARE ANNUAL WELLNESS VISIT  07/12/2019  Telephone Visit Disclaimer This Medicare AWV was conducted by telephone due to national recommendations for restrictions regarding the COVID-19 Pandemic (e.g. social distancing).  I verified, using two identifiers, that I am speaking with Rhonda Kelly or their authorized healthcare agent. I discussed the limitations, risks, security, and privacy concerns of performing an evaluation and management service by telephone and the potential availability of an in-person appointment in the future. The patient expressed understanding and agreed to proceed.   Subjective:  Rhonda Kelly is a 74 y.o. female patient of [name], Theador Hawthorne, FNP who had a Medicare Annual Wellness Visit today via telephone. Rhonda Kelly is Retired and lives with their spouse. she has 1 child. she reports that she is socially active and does interact with friends/family regularly. she is minimally physically active and enjoys gardening, doing yard work and reading.  Patient Care Team: Sharion Balloon, FNP as PCP - General (Family Medicine) Harlen Labs, MD as Referring Physician (Optometry) Irene Shipper, MD as Consulting Physician (Gastroenterology)  Advanced Directives 07/12/2019 07/09/2018 07/08/2017 07/08/2017 06/12/2016 09/24/2015 02/05/2015  Does Patient Have a Medical Advance Directive? Yes Yes - Yes Yes Yes Yes  Type of Advance Directive [place];Living will [place];Living will - [place];Living will [place];Living will Living will [place];Living will  Does patient want to make changes to medical advance directive? No - Patient declined No - Patient declined - - No - Patient declined - No - Patient declined  Copy of [employer] in Chart? No - copy requested No - copy requested No - copy requested No - copy requested No - copy requested - No - copy requested    Hospital  Utilization Over the Past 12 Months: # of hospitalizations or ER visits: 0 # of surgeries: 1  Review of Systems    Patient reports that her overall health is unchanged compared to last year.  History obtained from chart review  Patient Reported Readings (BP, Pulse, CBG, Weight, etc) none  Pain Assessment Pain : No/denies pain     Current Medications & Allergies (verified) Allergies as of 07/12/2019   No Known Allergies      Medication List        Accurate as of July 12, 2019 10:07 AM. If you have any questions, ask your nurse or doctor.          STOP taking these medications    nicotine polacrilex 2 MG gum Commonly known as: Nicorette   varenicline 0.5 MG X 11 & 1 MG X 42 tablet Commonly known as: Chantix Starting Month Pak   varenicline 1 MG tablet Commonly known as: Chantix Continuing Month Pak       TAKE these medications    ASCORBIC ACID PO Take by mouth.   aspirin 81 MG tablet Take 81 mg by mouth daily.   atorvastatin 20 MG tablet Commonly known as: LIPITOR TAKE ONE (1) TABLET EACH DAY   BLACK ELDERBERRY PO Take by mouth.   Cholecalciferol 25 MCG (1000 UT) Tbdp Take 1,000 Int'l Units by mouth daily.   Co-Enzyme Q-10 100 MG Caps Take 100 mg by mouth daily.   denosumab 60 MG/ML Soln injection Commonly known as: Prolia Inject 60 mg into the skin every 6 (six) months. Administer in upper arm, thigh, or abdomen   KRILL OIL PO Take 1 capsule by mouth  daily.   levothyroxine 50 MCG tablet Commonly known as: SYNTHROID Take 1 tablet (50 mcg total) by mouth daily.   multivitamin-lutein Caps capsule Take 1 capsule by mouth daily. icaps Areds 2   Restasis 0.05 % ophthalmic emulsion Generic drug: cycloSPORINE   tobramycin-dexamethasone ophthalmic solution Commonly known as: TOBRADEX Place 2 drops into the left eye every 6 (six) hours.   ZINC PO Take by mouth.        History (reviewed): Past Medical History:  Diagnosis Date    Allergy    some seasonal- otc meds prn only   Bladder prolapse, female, acquired    surgery pending to repair 09-24-15   Cataract    small   Colon polyp    GERD (gastroesophageal reflux disease)    Hyperlipidemia    Macular degeneration    Osteoporosis    Thyroid disease    25 years ago- no meds now    Past Surgical History:  Procedure Laterality Date   ABDOMINAL HYSTERECTOMY Bilateral 1993   leiomyomata, bleeding   COLONOSCOPY     FOOT SURGERY  1977   right   MOUTH SURGERY     dental implants x 2   OOPHORECTOMY     BSO   POLYPECTOMY     SQUAMOUS CELL CARCINOMA EXCISION Left 2018   SCC removed from upper left leg   TONSILLECTOMY  1972   TUBAL LIGATION     Family History  Problem Relation Age of Onset   Colon cancer Mother 16   Heart attack Mother        during gall bladder surgery   Heart disease Mother    Colon cancer Maternal Grandmother 53   Cancer Father        prostate   Diabetes Sister    Colon polyps Sister    Stroke Brother    Heart attack Brother    CAD Brother    Heart attack Brother    CAD Brother    Diabetes Brother    Colon cancer Maternal Aunt    Colon cancer Maternal Uncle    Stomach cancer Neg Hx    Social History   Socioeconomic History   Marital status: Married    Spouse name: Gwyndolyn Saxon   Number of children: 1   Years of education: 14   Highest education level: Associate degree: academic program  Occupational History   Occupation: Retired    Fish farm manager: [employer]    Comment: Retired in 2009  Tobacco Use   Smoking status: Current Every Day Smoker    Packs/day: 0.50    Years: 45.00    Pack years: 22.50    Types: Cigarettes   Smokeless tobacco: Never Used  Substance and Sexual Activity   Alcohol use: Not Currently    Alcohol/week: 0.0 standard drinks   Drug use: No   Sexual activity: Yes    Birth control/protection: Surgical    Comment: 1st intercourse 74 yo-Fewer than 5 partners  Other Topics Concern   Not on file  Social History  Narrative   Not on file   Social Determinants of Health   Financial Resource Strain: Low Risk    Difficulty of Paying Living Expenses: Not hard at all  Food Insecurity: No Food Insecurity   Worried About Charity fundraiser in the Last Year: Never true   Ran Out of Food in the Last Year: Never true  Transportation Needs: No Transportation Needs   Lack of Transportation (Medical): No   Lack of  Transportation (Non-Medical): No  Physical Activity: Inactive   Days of Exercise per Week: 0 days   Minutes of Exercise per Session: 0 min  Stress: No Stress Concern Present   Feeling of Stress : Not at all  Social Connections: Not Isolated   Frequency of Communication with Friends and Family: More than three times a week   Frequency of Social Gatherings with Friends and Family: More than three times a week   Attends Religious Services: More than 4 times per year   Active Member of [employer] or Organizations: Yes   Attends Archivist Meetings: More than 4 times per year   Marital Status: Married    Activities of Daily Living In your present state of health, do you have any difficulty performing the following activities: 07/12/2019  Hearing? N  Vision? N  Comment wears glasses-gets yearly eye exam  Difficulty concentrating or making decisions? N  Walking or climbing stairs? N  Dressing or bathing? N  Doing errands, shopping? N  Preparing Food and eating ? N  Using the Toilet? N  In the past six months, have you accidently leaked urine? N  Do you have problems with loss of bowel control? N  Managing your Medications? N  Managing your Finances? N  Housekeeping or managing your Housekeeping? N  Some recent data might be hidden    Patient Education/ Literacy How often do you need to have someone help you when you read instructions, pamphlets, or other written materials from your doctor or pharmacy?: 1 - Never What is the last grade level you completed in school?: Associates  Degree  Exercise Current Exercise Habits: The patient does not participate in regular exercise at present, Exercise limited by: None identified  Diet Patient reports consuming 2 meals a day and 2 snack(s) a day Patient reports that her primary diet is: Regular Patient reports that she does have regular access to food.   Depression Screen PHQ 2/9 Scores 07/12/2019 09/14/2018 03/16/2018 01/19/2018 09/07/2017 07/08/2017 04/20/2017  PHQ - 2 Score 0 0 0 0 0 0 0     Fall Risk Fall Risk  07/12/2019 09/14/2018 03/16/2018 01/19/2018 09/07/2017  Falls in the past year? 0 0 No No No     Objective:  Roene Mory Teems seemed alert and oriented and she participated appropriately during our telephone visit.  Blood Pressure Weight BMI  BP Readings from Last 3 Encounters:  09/14/18 (!) 143/70  07/09/18 140/66  03/16/18 121/70   Wt Readings from Last 3 Encounters:  09/14/18 118 lb 6.4 oz (53.7 kg)  07/09/18 116 lb (52.6 kg)  03/16/18 115 lb (52.2 kg)   BMI Readings from Last 1 Encounters:  09/14/18 23.12 kg/m    *Unable to obtain current vital signs, weight, and BMI due to telephone visit type  Hearing/Vision  Renleigh did not seem to have difficulty with hearing/understanding during the telephone conversation Reports that she has had a formal eye exam by an eye care professional within the past year Reports that she has not had a formal hearing evaluation within the past year *Unable to fully assess hearing and vision during telephone visit type  Cognitive Function: 6CIT Screen 07/12/2019 06/12/2016  What Year? 0 points 0 points  What month? 0 points 0 points  What time? 0 points 0 points  Count back from 20 0 points 0 points  Months in reverse 0 points 0 points  Repeat phrase 0 points 0 points  Total Score 0 0   (Normal:0-7,  Significant for Dysfunction: >8)  Normal Cognitive Function Screening: Yes   Immunization & Health Maintenance Record Immunization History  Administered Date(s) Administered    Influenza, High Dose Seasonal PF 06/12/2016, 04/20/2017, 05/10/2018   Influenza,inj,Quad PF,6+ Mos 05/03/2014, 04/10/2015   Pneumococcal Conjugate-13 05/03/2014   Pneumococcal Polysaccharide-23 07/31/2015   Zoster 02/05/2015   Zoster Recombinat (Shingrix) 07/08/2017, 07/09/2018    Health Maintenance  Topic Date Due   TETANUS/TDAP  03[birth year]06/1965   COLONOSCOPY  10/08/2018   INFLUENZA VACCINE  01/29/2019   DEXA SCAN  07/09/2020   MAMMOGRAM  07/28/2020   Hepatitis C Screening  Completed   PNA vac Low Risk Adult  Completed       Assessment  This is a routine wellness examination for Bassy A Rinks.  Health Maintenance: Due or Overdue Health Maintenance Due  Topic Date Due   TETANUS/TDAP  03[birth year]06/1965   COLONOSCOPY  10/08/2018   INFLUENZA VACCINE  01/29/2019    Rhonda Skill Discher does not need a referral for Community Assistance: Care Management:   no Social Work:    no Prescription Assistance:  no Nutrition/Diabetes Education:  no   Plan:  Personalized Goals Goals Addressed             This Visit's Progress    DIET - INCREASE WATER INTAKE       Try to drink 6-8 glasses of water daily       Personalized Health Maintenance & Screening Recommendations  Td vaccine Colorectal cancer screening Advanced directives: has an advanced directive - a copy HAS NOT been provided.  Lung Cancer Screening Recommended: yes-declines at this time (Low Dose CT Chest recommended if Age 74-80 years, 30 pack-year currently smoking OR have quit w/in past 15 years) Hepatitis C Screening recommended: no HIV Screening recommended: no  Advanced Directives: Written information was not prepared per patient's request.  Referrals & Orders No orders of the defined types were placed in this encounter.   Follow-up Plan Follow-up with Sharion Balloon, FNP as planned Schedule your Colonoscopy with GI as soon as you feel comfortable during the Pandemic Consider TDAP vaccine at your next visit with  your PCP  Bring a copy of your Advanced Directives in for our records   I have personally reviewed and noted the following in the patient's chart:   Medical and social history Use of alcohol, tobacco or illicit drugs  Current medications and supplements Functional ability and status Nutritional status Physical activity Advanced directives List of other physicians Hospitalizations, surgeries, and ER visits in previous 12 months Vitals Screenings to include cognitive, depression, and falls Referrals and appointments  In addition, I have reviewed and discussed with Rhonda Skill Slattery certain preventive protocols, quality metrics, and best practice recommendations. A written personalized care plan for preventive services as well as general preventive health recommendations is available and can be mailed to the patient at her request.      Milas Hock, LPN  [phone]   I have reviewed and agree with the above AWV documentation.   Evelina Dun, FNP

## 2019-07-21 DIAGNOSIS — Z23 Encounter for immunization: Secondary | ICD-10-CM | POA: Diagnosis not present

## 2019-08-26 DIAGNOSIS — Z23 Encounter for immunization: Secondary | ICD-10-CM | POA: Diagnosis not present

## 2019-08-29 ENCOUNTER — Other Ambulatory Visit: Payer: Self-pay | Admitting: Family

## 2019-08-29 DIAGNOSIS — E039 Hypothyroidism, unspecified: Secondary | ICD-10-CM

## 2019-09-20 ENCOUNTER — Ambulatory Visit (INDEPENDENT_AMBULATORY_CARE_PROVIDER_SITE_OTHER): Payer: Medicare Other | Admitting: Family

## 2019-09-20 ENCOUNTER — Encounter: Payer: Self-pay | Admitting: Family

## 2019-09-20 ENCOUNTER — Other Ambulatory Visit: Payer: Self-pay

## 2019-09-20 VITALS — BP 143/72 | HR 64 | Temp 97.8°F | Ht 60.0 in | Wt 115.0 lb

## 2019-09-20 DIAGNOSIS — I700 Atherosclerosis of aorta: Secondary | ICD-10-CM | POA: Diagnosis not present

## 2019-09-20 DIAGNOSIS — E039 Hypothyroidism, unspecified: Secondary | ICD-10-CM

## 2019-09-20 DIAGNOSIS — Z720 Tobacco use: Secondary | ICD-10-CM

## 2019-09-20 DIAGNOSIS — Z87891 Personal history of nicotine dependence: Secondary | ICD-10-CM

## 2019-09-20 DIAGNOSIS — E785 Hyperlipidemia, unspecified: Secondary | ICD-10-CM | POA: Diagnosis not present

## 2019-09-20 DIAGNOSIS — K219 Gastro-esophageal reflux disease without esophagitis: Secondary | ICD-10-CM

## 2019-09-20 DIAGNOSIS — Z23 Encounter for immunization: Secondary | ICD-10-CM

## 2019-09-20 MED ORDER — LEVOTHYROXINE SODIUM 50 MCG PO TABS: 50.0000 ug | ORAL_TABLET | Freq: Every day | ORAL | 4 refills | Status: DC

## 2019-09-20 MED ORDER — TRIAMCINOLONE ACETONIDE 0.5 % EX OINT: 1.0000 "application " | TOPICAL_OINTMENT | Freq: Two times a day (BID) | CUTANEOUS | 0 refills | Status: DC

## 2019-09-20 MED ORDER — ATORVASTATIN CALCIUM 20 MG PO TABS: ORAL_TABLET | ORAL | 4 refills | Status: DC

## 2019-09-20 NOTE — Patient Instructions (Signed)
Health Maintenance After Age 72 After age 72, you are at a higher risk for certain long-term diseases and infections as well as injuries from falls. Falls are a major cause of broken bones and head injuries in people who are older than age 72. Getting regular preventive care can help to keep you healthy and well. Preventive care includes getting regular testing and making lifestyle changes as recommended by your health care provider. Talk with your health care provider about:  Which screenings and tests you should have. A screening is a test that checks for a disease when you have no symptoms.  A diet and exercise plan that is right for you. What should I know about screenings and tests to prevent falls? Screening and testing are the best ways to find a health problem early. Early diagnosis and treatment give you the best chance of managing medical conditions that are common after age 72. Certain conditions and lifestyle choices may make you more likely to have a fall. Your health care provider may recommend:  Regular vision checks. Poor vision and conditions such as cataracts can make you more likely to have a fall. If you wear glasses, make sure to get your prescription updated if your vision changes.  Medicine review. Work with your health care provider to regularly review all of the medicines you are taking, including over-the-counter medicines. Ask your health care provider about any side effects that may make you more likely to have a fall. Tell your health care provider if any medicines that you take make you feel dizzy or sleepy.  Osteoporosis screening. Osteoporosis is a condition that causes the bones to get weaker. This can make the bones weak and cause them to break more easily.  Blood pressure screening. Blood pressure changes and medicines to control blood pressure can make you feel dizzy.  Strength and balance checks. Your health care provider may recommend certain tests to check your  strength and balance while standing, walking, or changing positions.  Foot health exam. Foot pain and numbness, as well as not wearing proper footwear, can make you more likely to have a fall.  Depression screening. You may be more likely to have a fall if you have a fear of falling, feel emotionally low, or feel unable to do activities that you used to do.  Alcohol use screening. Using too much alcohol can affect your balance and may make you more likely to have a fall. What actions can I take to lower my risk of falls? General instructions  Talk with your health care provider about your risks for falling. Tell your health care provider if: ? You fall. Be sure to tell your health care provider about all falls, even ones that seem minor. ? You feel dizzy, sleepy, or off-balance.  Take over-the-counter and prescription medicines only as told by your health care provider. These include any supplements.  Eat a healthy diet and maintain a healthy weight. A healthy diet includes low-fat dairy products, low-fat (lean) meats, and fiber from whole grains, beans, and lots of fruits and vegetables. Home safety  Remove any tripping hazards, such as rugs, cords, and clutter.  Install safety equipment such as grab bars in bathrooms and safety rails on stairs.  Keep rooms and walkways well-lit. Activity   Follow a regular exercise program to stay fit. This will help you maintain your balance. Ask your health care provider what types of exercise are appropriate for you.  If you need a cane or   walker, use it as recommended by your health care provider.  Wear supportive shoes that have nonskid soles. Lifestyle  Do not drink alcohol if your health care provider tells you not to drink.  If you drink alcohol, limit how much you have: ? 0-1 drink a day for women. ? 0-2 drinks a day for men.  Be aware of how much alcohol is in your drink. In the [place] one drink equals one typical bottle of beer (12  oz), one-half glass of wine (5 oz), or one shot of hard liquor (1 oz).  Do not use any products that contain nicotine or tobacco, such as cigarettes and e-cigarettes. If you need help quitting, ask your health care provider. Summary  Having a healthy lifestyle and getting preventive care can help to protect your health and wellness after age 72.  Screening and testing are the best way to find a health problem early and help you avoid having a fall. Early diagnosis and treatment give you the best chance for managing medical conditions that are more common for people who are older than age 72.  Falls are a major cause of broken bones and head injuries in people who are older than age 72. Take precautions to prevent a fall at home.  Work with your health care provider to learn what changes you can make to improve your health and wellness and to prevent falls. This information is not intended to replace advice given to you by your health care provider. Make sure you discuss any questions you have with your health care provider. Document Revised: 10/07/2018 Document Reviewed: 04/29/2017 Elsevier Patient Education  2020 Elsevier Inc.  

## 2019-09-20 NOTE — Addendum Note (Signed)
Addended by: Brynda Peon F on: 09/20/2019 12:04 PM   Modules accepted: Orders

## 2019-09-20 NOTE — Progress Notes (Signed)
Subjective:    Patient ID: Rhonda Kelly, female    DOB: Feb 05, 1946, 74 y.o.   MRN: [id number]  Chief Complaint  Patient presents with  . Medical Management of Chronic Issues    wants chest ct for smoking screening   . Rash    YEARS    PT presents to the office today for chronic follow up.  Rash This is a chronic problem. The current episode started more than 1 year ago. The problem has been waxing and waning since onset. The affected locations include the back. The rash is characterized by dryness and itchiness. She was exposed to nothing. Pertinent negatives include no diarrhea or fatigue. Past treatments include anti-itch cream. The treatment provided mild relief.  Hyperlipidemia This is a chronic problem. The current episode started more than 1 year ago. The problem is uncontrolled. Recent lipid tests were reviewed and are high. Current antihyperlipidemic treatment includes statins. The current treatment provides moderate improvement of lipids. Risk factors for coronary artery disease include dyslipidemia.  Thyroid Problem Presents for follow-up visit. Symptoms include dry skin. Patient reports no constipation, diarrhea or fatigue. Her past medical history is significant for hyperlipidemia.  Nicotine Dependence Presents for follow-up visit. Symptoms are negative for fatigue. Her urge triggers include company of smokers. The symptoms have been stable. She smokes < 1/2 a pack of cigarettes per day.      Review of Systems  Constitutional: Negative for fatigue.  Gastrointestinal: Negative for constipation and diarrhea.  Skin: Positive for rash.  All other systems reviewed and are negative.      Objective:   Physical Exam Vitals reviewed.  Constitutional:      General: She is not in acute distress.    Appearance: She is well-developed.  HENT:     Head: Normocephalic and atraumatic.     Right Ear: Tympanic membrane normal.     Left Ear: Tympanic membrane normal.  Eyes:   Pupils: Pupils are equal, round, and reactive to light.  Neck:     Thyroid: No thyromegaly.  Cardiovascular:     Rate and Rhythm: Normal rate and regular rhythm.     Heart sounds: Normal heart sounds. No murmur.  Pulmonary:     Effort: Pulmonary effort is normal. No respiratory distress.     Breath sounds: Normal breath sounds. No wheezing.  Abdominal:     General: Bowel sounds are normal. There is no distension.     Palpations: Abdomen is soft.     Tenderness: There is no abdominal tenderness.  Musculoskeletal:        General: No tenderness. Normal range of motion.     Cervical back: Normal range of motion and neck supple.  Skin:    General: Skin is warm and dry.  Neurological:     Mental Status: She is alert and oriented to person, place, and time.     Cranial Nerves: No cranial nerve deficit.     Deep Tendon Reflexes: Reflexes are normal and symmetric.  Psychiatric:        Behavior: Behavior normal.        Thought Content: Thought content normal.        Judgment: Judgment normal.     BP (!) 143/72   Pulse 64   Temp 97.8 F (36.6 C) (Temporal)   Ht 5' (1.524 m)   Wt 115 lb (52.2 kg)   SpO2 98%   BMI 22.46 kg/m        Assessment & Plan:  Rhonda Kelly comes in today with chief complaint of Medical Management of Chronic Issues (wants chest ct for smoking screening ) and Rash (YEARS )   Diagnosis and orders addressed:  1. Aortic atherosclerosis (HCC) - CMP14+EGFR - CBC with Differential/Platelet - Lipid panel - atorvastatin (LIPITOR) 20 MG tablet; TAKE ONE (1) TABLET EACH DAY  Dispense: 90 tablet; Refill: 4  2. Gastroesophageal reflux disease, unspecified whether esophagitis present - CMP14+EGFR - CBC with Differential/Platelet  3. Hypothyroidism, unspecified type - CMP14+EGFR - CBC with Differential/Platelet - TSH - levothyroxine (SYNTHROID) 50 MCG tablet; Take 1 tablet (50 mcg total) by mouth daily before breakfast. (Needs to be seen before next refill)   Dispense: 90 tablet; Refill: 4  4. Hyperlipidemia, unspecified hyperlipidemia type  - CMP14+EGFR - CBC with Differential/Platelet - Lipid panel - atorvastatin (LIPITOR) 20 MG tablet; TAKE ONE (1) TABLET EACH DAY  Dispense: 90 tablet; Refill: 4  5. Tobacco abuse - CT CHEST LUNG CA SCREEN LOW DOSE W/O CM; Future - CMP14+EGFR - CBC with Differential/Platelet  6. History of smoking greater than 50 pack years - CT CHEST LUNG CA SCREEN LOW DOSE W/O CM; Future - CMP14+EGFR - CBC with Differential/Platelet   Labs pending Health Maintenance reviewed Diet and exercise encouraged  Follow up plan: 6 months   Evelina Dun, FNP

## 2019-09-21 LAB — CBC WITH DIFFERENTIAL/PLATELET
Basophils Absolute: 0.1 10*3/uL (ref 0.0–0.2)
Basos: 1 %
EOS (ABSOLUTE): 0.2 10*3/uL (ref 0.0–0.4)
Eos: 3 %
Hematocrit: 46.6 % (ref 34.0–46.6)
Hemoglobin: 15.4 g/dL (ref 11.1–15.9)
Immature Grans (Abs): 0 10*3/uL (ref 0.0–0.1)
Immature Granulocytes: 0 %
Lymphocytes Absolute: 2.7 10*3/uL (ref 0.7–3.1)
Lymphs: 37 %
MCH: 30 pg (ref 26.6–33.0)
MCHC: 33 g/dL (ref 31.5–35.7)
MCV: 91 fL (ref 79–97)
Monocytes Absolute: 0.5 10*3/uL (ref 0.1–0.9)
Monocytes: 7 %
Neutrophils Absolute: 3.9 10*3/uL (ref 1.4–7.0)
Neutrophils: 52 %
Platelets: 235 10*3/uL (ref 150–450)
RBC: 5.13 x10E6/uL (ref 3.77–5.28)
RDW: 13.3 % (ref 11.7–15.4)
WBC: 7.3 10*3/uL (ref 3.4–10.8)

## 2019-09-21 LAB — CMP14+EGFR
ALT: 25 IU/L (ref 0–32)
AST: 33 IU/L (ref 0–40)
Albumin/Globulin Ratio: 2 (ref 1.2–2.2)
Albumin: 4.6 g/dL (ref 3.7–4.7)
Alkaline Phosphatase: 92 IU/L (ref 39–117)
BUN/Creatinine Ratio: 13 (ref 12–28)
BUN: 8 mg/dL (ref 8–27)
Bilirubin Total: <0.2 mg/dL (ref 0.0–1.2)
CO2: 21 mmol/L (ref 20–29)
Calcium: 9.7 mg/dL (ref 8.7–10.3)
Chloride: 106 mmol/L (ref 96–106)
Creatinine, Ser: 0.62 mg/dL (ref 0.57–1.00)
GFR calc Af Amer: 105 mL/min/{1.73_m2} (ref >59)
GFR calc non Af Amer: 91 mL/min/{1.73_m2} (ref >59)
Globulin, Total: 2.3 g/dL (ref 1.5–4.5)
Glucose: 82 mg/dL (ref 65–99)
Potassium: 4.4 mmol/L (ref 3.5–5.2)
Sodium: 142 mmol/L (ref 134–144)
Total Protein: 6.9 g/dL (ref 6.0–8.5)

## 2019-09-21 LAB — TSH: TSH: 2.42 u[IU]/mL (ref 0.450–4.500)

## 2019-09-21 LAB — LIPID PANEL
Chol/HDL Ratio: 2.9 ratio (ref 0.0–4.4)
Cholesterol, Total: 210 mg/dL — ABNORMAL HIGH (ref 100–199)
HDL: 72 mg/dL (ref >39)
LDL Chol Calc (NIH): 124 mg/dL — ABNORMAL HIGH (ref 0–99)
Triglycerides: 81 mg/dL (ref 0–149)
VLDL Cholesterol Cal: 14 mg/dL (ref 5–40)

## 2019-09-22 ENCOUNTER — Other Ambulatory Visit: Payer: Self-pay | Admitting: Family

## 2019-10-07 ENCOUNTER — Encounter (HOSPITAL_COMMUNITY): Payer: Self-pay | Admitting: *Deleted

## 2019-10-07 ENCOUNTER — Other Ambulatory Visit (HOSPITAL_COMMUNITY): Payer: Self-pay | Admitting: *Deleted

## 2019-10-07 DIAGNOSIS — Z720 Tobacco use: Secondary | ICD-10-CM

## 2019-10-07 DIAGNOSIS — Z122 Encounter for screening for malignant neoplasm of respiratory organs: Secondary | ICD-10-CM

## 2019-10-07 DIAGNOSIS — Z87891 Personal history of nicotine dependence: Secondary | ICD-10-CM

## 2019-10-07 NOTE — Progress Notes (Signed)
Received referral for initial lung cancer screening scan. Contacted patient and obtained smoking history (started age 74 at 0.5 ppd until age 44 1ppd until now, current smoker, 46  pack year) as well as answering questions related to the screening process.  Patient denies signs/symptoms of lung cancer such as weight loss or hemoptysis.  Patient denies comorbidity that would prevent curative treatment if lung cancer were to be found.  Patient is scheduled for shared decision making visit and CT scan on 4/30 at 1030.  Patient has had LDCT done in 2017 with L-RADS 2S and 12 month follow up.  She did not follow-up after that time.  We will see her for [facility] and follow up scan.

## 2019-10-07 NOTE — Progress Notes (Signed)
Orders placed for LDCT scan.

## 2019-10-21 NOTE — Patient Instructions (Signed)
You were seen today for your shared decision making visit and a low-dose CT scan for lung cancer screening.    

## 2019-10-28 ENCOUNTER — Ambulatory Visit (HOSPITAL_COMMUNITY)
Admission: RE | Admit: 2019-10-28 | Discharge: 2019-10-28 | Disposition: A | Discharge status: Home / Self Care | Payer: Medicare Other | Source: Ambulatory Visit | Attending: Nurse Practitioner | Admitting: Nurse Practitioner

## 2019-10-28 ENCOUNTER — Other Ambulatory Visit: Payer: Self-pay

## 2019-10-28 ENCOUNTER — Inpatient Hospital Stay (HOSPITAL_COMMUNITY): Payer: Medicare Other | Attending: Nurse Practitioner | Admitting: Nurse Practitioner

## 2019-10-28 DIAGNOSIS — F17210 Nicotine dependence, cigarettes, uncomplicated: Secondary | ICD-10-CM

## 2019-10-28 DIAGNOSIS — Z87891 Personal history of nicotine dependence: Secondary | ICD-10-CM | POA: Insufficient documentation

## 2019-10-28 DIAGNOSIS — Z122 Encounter for screening for malignant neoplasm of respiratory organs: Secondary | ICD-10-CM | POA: Insufficient documentation

## 2019-10-28 DIAGNOSIS — Z720 Tobacco use: Secondary | ICD-10-CM

## 2019-10-28 NOTE — Assessment & Plan Note (Signed)
1.  Tobacco abuse: -This patient meets the criteria for low-dose CT lung cancer screening. -She is asymptomatic for any signs or symptoms of lung cancer. -The shared decision making visit discussion including risk and benefits of screening, potential for follow-up, diagnostic testing for abnormal scans, potential for false positive test, over diagnosis and discussion about total radiation exposure. -Patient stated willingness to undergo diagnostics and treatment as needed. -Patient is counseled on smoking cessation to decrease risk of lung cancer, pulmonary disease, heart disease and stroke. -Patient was given a resource card information with free nicotine replacement therapy and information about free smoking cessation classes. -Patient will present for LD CT scan today and follow-up with PCP.

## 2019-10-28 NOTE — Progress Notes (Signed)
[facility] NOTE  Patient Care Team: Sharion Balloon, FNP as PCP - General (Family Medicine) Harlen Labs, MD as Referring Physician (Optometry) Irene Shipper, MD as Consulting Physician (Gastroenterology)  CHIEF COMPLAINTS/PURPOSE OF CONSULTATION: Shared decision making visit for lung cancer screening  HISTORY OF PRESENTING ILLNESS:  Rhonda Kelly 74 y.o. female presents today for shared decision making visit for lung cancer screening.  She has a past medical history significant for GERD, hyperlipidemia, macular degeneration, osteoporosis and thyroid disease.  Patient states she started smoking at the age of 5.  At her most she smoked 1 pack/day.  She currently smokes a half a pack to a pack a day.  She does occasionally have a cough without sputum production.  She denies any alcohol or illicit drug use.  She has a family history significant for mother with colon cancer, 3 maternal aunts with breast cancer, and a father with prostate cancer.  MEDICAL HISTORY:  Past Medical History:  Diagnosis Date  . Allergy    some seasonal- otc meds prn only  . Bladder prolapse, female, acquired    surgery pending to repair 09-24-15  . Cataract    small  . Colon polyp   . GERD (gastroesophageal reflux disease)   . Hyperlipidemia   . Macular degeneration   . Osteoporosis   . Thyroid disease    25 years ago- no meds now     SURGICAL HISTORY: Past Surgical History:  Procedure Laterality Date  . ABDOMINAL HYSTERECTOMY Bilateral 1993   leiomyomata, bleeding  . COLONOSCOPY    . [place]   right  . MOUTH SURGERY     dental implants x 2  . OOPHORECTOMY     BSO  . POLYPECTOMY    . SQUAMOUS CELL CARCINOMA EXCISION Left 2018   SCC removed from upper left leg  . TONSILLECTOMY  1972  . TUBAL LIGATION      SOCIAL HISTORY: Social History   Socioeconomic History  . Marital status: Married    Spouse name: Gwyndolyn Saxon  . Number of children: 1  . Years of  education: 69  . Highest education level: Associate degree: academic program  Occupational History  . Occupation: Retired    Fish farm manager: [employer]    Comment: Retired in 2009  Tobacco Use  . Smoking status: Current Every Day Smoker    Packs/day: 0.50    Years: 45.00    Pack years: 22.50    Types: Cigarettes  . Smokeless tobacco: Never Used  Substance and Sexual Activity  . Alcohol use: Not Currently    Alcohol/week: 0.0 standard drinks  . Drug use: No  . Sexual activity: Yes    Birth control/protection: Surgical    Comment: 1st intercourse 74 yo-Fewer than 5 partners  Other Topics Concern  . Not on file  Social History Narrative  . Not on file   Social Determinants of Health   Financial Resource Strain: Low Risk   . Difficulty of Paying Living Expenses: Not hard at all  Food Insecurity: No Food Insecurity  . Worried About Charity fundraiser in the Last Year: Never true  . Ran Out of Food in the Last Year: Never true  Transportation Needs: No Transportation Needs  . Lack of Transportation (Medical): No  . Lack of Transportation (Non-Medical): No  Physical Activity: Inactive  . Days of Exercise per Week: 0 days  . Minutes of Exercise per Session: 0 min  Stress:  No Stress Concern Present  . Feeling of Stress : Not at all  Social Connections: Not Isolated  . Frequency of Communication with Friends and Family: More than three times a week  . Frequency of Social Gatherings with Friends and Family: More than three times a week  . Attends Religious Services: More than 4 times per year  . Active Member of Clubs or Organizations: Yes  . Attends Archivist Meetings: More than 4 times per year  . Marital Status: Married  Human resources officer Violence: Not At Risk  . Fear of Current or Ex-Partner: No  . Emotionally Abused: No  . Physically Abused: No  . Sexually Abused: No    FAMILY HISTORY: Family History  Problem Relation Age of Onset  . Colon cancer Mother 69  . Heart  attack Mother        during gall bladder surgery  . Heart disease Mother   . Colon cancer Maternal Grandmother 77  . Cancer Father        prostate  . Diabetes Sister   . Colon polyps Sister   . Stroke Brother   . Heart attack Brother   . CAD Brother   . Heart attack Brother   . CAD Brother   . Diabetes Brother   . Colon cancer Maternal Aunt   . Colon cancer Maternal Uncle   . Stomach cancer Neg Hx     ALLERGIES:  has No Known Allergies.  MEDICATIONS:  Current Outpatient Medications  Medication Sig Dispense Refill  . ASCORBIC ACID PO Take by mouth.    Marland Kitchen aspirin 81 MG tablet Take 81 mg by mouth daily.    Marland Kitchen atorvastatin (LIPITOR) 20 MG tablet TAKE ONE (1) TABLET EACH DAY 90 tablet 4  . BLACK ELDERBERRY PO Take by mouth.    . Cholecalciferol 1000 units TBDP Take 1,000 Int'l Units by mouth daily.    Marland Kitchen Co-Enzyme Q-10 100 MG CAPS Take 100 mg by mouth daily.    Marland Kitchen denosumab (PROLIA) 60 MG/ML SOLN injection Inject 60 mg into the skin every 6 (six) months. Administer in upper arm, thigh, or abdomen (Patient not taking: Reported on 09/20/2019) 1 mL 1  . KRILL OIL PO Take 1 capsule by mouth daily.    Marland Kitchen levothyroxine (SYNTHROID) 50 MCG tablet Take 1 tablet (50 mcg total) by mouth daily before breakfast. (Needs to be seen before next refill) 90 tablet 4  . Multiple Vitamins-Minerals (ZINC PO) Take by mouth.    . multivitamin-lutein (OCUVITE-LUTEIN) CAPS capsule Take 1 capsule by mouth daily. icaps Areds 2    . RESTASIS 0.05 % ophthalmic emulsion     . triamcinolone ointment (KENALOG) 0.5 % Apply 1 application topically 2 (two) times daily. 30 g 0   No current facility-administered medications for this visit.    LABORATORY DATA:  I have reviewed the data as listed Lab Results  Component Value Date   WBC 7.3 09/20/2019   HGB 15.4 09/20/2019   HCT 46.6 09/20/2019   MCV 91 09/20/2019   PLT 235 09/20/2019     Chemistry      Component Value Date/Time   NA 142 09/20/2019 1145   K 4.4  09/20/2019 1145   CL 106 09/20/2019 1145   CO2 21 09/20/2019 1145   BUN 8 09/20/2019 1145   CREATININE 0.62 09/20/2019 1145   CREATININE 0.76 12/16/2012 0946      Component Value Date/Time   CALCIUM 9.7 09/20/2019 1145   ALKPHOS 92  09/20/2019 1145   AST 33 09/20/2019 1145   ALT 25 09/20/2019 1145   BILITOT <0.2 09/20/2019 1145      ASSESSMENT & PLAN:  Tobacco abuse 1.  Tobacco abuse: -This patient meets the criteria for low-dose CT lung cancer screening. -She is asymptomatic for any signs or symptoms of lung cancer. -The shared decision making visit discussion including risk and benefits of screening, potential for follow-up, diagnostic testing for abnormal scans, potential for false positive test, over diagnosis and discussion about total radiation exposure. -Patient stated willingness to undergo diagnostics and treatment as needed. -Patient is counseled on smoking cessation to decrease risk of lung cancer, pulmonary disease, heart disease and stroke. -Patient was given a resource card information with free nicotine replacement therapy and information about free smoking cessation classes. -Patient will present for LD CT scan today and follow-up with PCP.    All questions were answered. The patient knows to call the clinic with any problems, questions or concerns. I spent 10 mins counseling the patient face to face. The total time spent in the appointment was 20 mins and more than 50% was on counseling.     Glennie Isle, NP-C 10/28/2019 10:19 AM

## 2019-11-02 ENCOUNTER — Encounter (HOSPITAL_COMMUNITY): Payer: Self-pay | Admitting: *Deleted

## 2019-11-02 NOTE — Progress Notes (Signed)
Patient notified via mail of LDCT lung cancer screening results with recommendations to follow up in 12 months.  Also notified of incidental findings and need to follow up with PCP.  Patient's referring provider was sent a copy of results.    IMPRESSION: 1. Lung-RADS 2S, benign appearance or behavior. Continue annual screening with low-dose chest CT without contrast in 12 months. 2. The "S" modifier above refers to potentially clinically significant non lung cancer related findings. Specifically, there is aortic atherosclerosis, in addition to left main and 3 vessel coronary artery disease. Please note that although the presence of coronary artery calcium documents the presence of coronary artery disease, the severity of this disease and any potential stenosis cannot be assessed on this non-gated CT examination. Assessment for potential risk factor modification, dietary therapy or pharmacologic therapy may be warranted, if clinically indicated. 3. Mild diffuse bronchial wall thickening with very mild centrilobular and paraseptal emphysema; imaging findings suggestive of underlying COPD. 4. 1.4 cm left adrenal adenoma.  Aortic Atherosclerosis (ICD10-I70.0) and Emphysema (ICD10-J43.9).

## 2019-12-14 ENCOUNTER — Other Ambulatory Visit: Payer: Self-pay | Admitting: Family

## 2019-12-14 DIAGNOSIS — Z1231 Encounter for screening mammogram for malignant neoplasm of breast: Secondary | ICD-10-CM

## 2019-12-21 DIAGNOSIS — E039 Hypothyroidism, unspecified: Secondary | ICD-10-CM

## 2019-12-21 MED ORDER — LEVOTHYROXINE SODIUM 50 MCG PO TABS: 50.0000 ug | ORAL_TABLET | Freq: Every day | ORAL | 0 refills | Status: DC

## 2019-12-28 ENCOUNTER — Ambulatory Visit
Admission: RE | Admit: 2019-12-28 | Discharge: 2019-12-28 | Disposition: A | Discharge status: Home / Self Care | Payer: Medicare Other | Source: Ambulatory Visit | Attending: Family | Admitting: Family

## 2019-12-28 ENCOUNTER — Other Ambulatory Visit: Payer: Self-pay

## 2019-12-28 DIAGNOSIS — Z1231 Encounter for screening mammogram for malignant neoplasm of breast: Secondary | ICD-10-CM

## 2020-03-01 ENCOUNTER — Other Ambulatory Visit: Payer: Self-pay

## 2020-03-01 ENCOUNTER — Ambulatory Visit (INDEPENDENT_AMBULATORY_CARE_PROVIDER_SITE_OTHER): Payer: Medicare Other | Admitting: Family

## 2020-03-01 ENCOUNTER — Encounter: Payer: Self-pay | Admitting: Family

## 2020-03-01 ENCOUNTER — Other Ambulatory Visit: Payer: Medicare Other

## 2020-03-01 VITALS — BP 145/73 | HR 78 | Temp 97.8°F | Ht 60.0 in | Wt 111.8 lb

## 2020-03-01 DIAGNOSIS — K219 Gastro-esophageal reflux disease without esophagitis: Secondary | ICD-10-CM

## 2020-03-01 DIAGNOSIS — E785 Hyperlipidemia, unspecified: Secondary | ICD-10-CM

## 2020-03-01 DIAGNOSIS — M810 Age-related osteoporosis without current pathological fracture: Secondary | ICD-10-CM

## 2020-03-01 DIAGNOSIS — Z720 Tobacco use: Secondary | ICD-10-CM

## 2020-03-01 DIAGNOSIS — E039 Hypothyroidism, unspecified: Secondary | ICD-10-CM

## 2020-03-01 DIAGNOSIS — I700 Atherosclerosis of aorta: Secondary | ICD-10-CM

## 2020-03-01 MED ORDER — LEVOTHYROXINE SODIUM 50 MCG PO TABS: 50.0000 ug | ORAL_TABLET | Freq: Every day | ORAL | 0 refills | Status: DC

## 2020-03-01 MED ORDER — ALENDRONATE SODIUM 70 MG PO TABS: 70.0000 mg | ORAL_TABLET | ORAL | 3 refills | Status: DC

## 2020-03-01 MED ORDER — ATORVASTATIN CALCIUM 20 MG PO TABS: ORAL_TABLET | ORAL | 4 refills | Status: DC

## 2020-03-01 NOTE — Patient Instructions (Signed)
Osteoporosis  Osteoporosis is thinning and loss of density in your bones. Osteoporosis makes bones more brittle and fragile and more likely to break (fracture). Over time, osteoporosis can cause your bones to become so weak that they fracture after a minor fall. Bones in the hip, wrist, and spine are most likely to fracture due to osteoporosis. What are the causes? The exact cause of this condition is not known. What increases the risk? You may be at greater risk for osteoporosis if you:  Have a family history of the condition.  Have poor nutrition.  Use steroid medicines, such as prednisone.  Are female.  Are age 50 or older.  Smoke or have a history of smoking.  Are not physically active (are sedentary).  Are white (Caucasian) or of Asian descent.  Have a small body frame.  Take certain medicines, such as antiseizure medicines. What are the signs or symptoms? A fracture might be the first sign of osteoporosis, especially if the fracture results from a fall or injury that usually would not cause a bone to break. Other signs and symptoms include:  Pain in the neck or low back.  Stooped posture.  Loss of height. How is this diagnosed? This condition may be diagnosed based on:  Your medical history.  A physical exam.  A bone mineral density test, also called a DXA or DEXA test (dual-energy X-ray absorptiometry test). This test uses X-rays to measure the amount of minerals in your bones. How is this treated? The goal of treatment is to strengthen your bones and lower your risk for a fracture. Treatment may involve:  Making lifestyle changes, such as: ? Including foods with more calcium and vitamin D in your diet. ? Doing weight-bearing and muscle-strengthening exercises. ? Stopping tobacco use. ? Limiting alcohol intake.  Taking medicine to slow the process of bone loss or to increase bone density.  Taking daily supplements of calcium and vitamin D.  Taking  hormone replacement medicines, such as estrogen for women and testosterone for men.  Monitoring your levels of calcium and vitamin D. Follow these instructions at home:  Activity  Exercise as told by your health care provider. Ask your health care provider what exercises and activities are safe for you. You should do: ? Exercises that make you work against gravity (weight-bearing exercises), such as tai chi, yoga, or walking. ? Exercises to strengthen muscles, such as lifting weights. Lifestyle  Limit alcohol intake to no more than 1 drink a day for nonpregnant women and 2 drinks a day for men. One drink equals 12 oz of beer, 5 oz of wine, or 1 oz of hard liquor.  Do not use any products that contain nicotine or tobacco, such as cigarettes and e-cigarettes. If you need help quitting, ask your health care provider. Preventing falls  Use devices to help you move around (mobility aids) as needed, such as canes, walkers, scooters, or crutches.  Keep rooms well-lit and clutter-free.  Remove tripping hazards from walkways, including cords and throw rugs.  Install grab bars in bathrooms and safety rails on stairs.  Use rubber mats in the bathroom and other areas that are often wet or slippery.  Wear closed-toe shoes that fit well and support your feet. Wear shoes that have rubber soles or low heels.  Review your medicines with your health care provider. Some medicines can cause dizziness or changes in blood pressure, which can increase your risk of falling. General instructions  Include calcium and vitamin D in   your diet. Calcium is important for bone health, and vitamin D helps your body to absorb calcium. Good sources of calcium and vitamin D include: ? Certain fatty fish, such as salmon and tuna. ? Products that have calcium and vitamin D added to them (fortified products), such as fortified cereals. ? Egg yolks. ? Cheese. ? Liver.  Take over-the-counter and prescription medicines  only as told by your health care provider.  Keep all follow-up visits as told by your health care provider. This is important. Contact a health care provider if:  You have never been screened for osteoporosis and you are: ? A woman who is age 65 or older. ? A man who is age 70 or older. Get help right away if:  You fall or injure yourself. Summary  Osteoporosis is thinning and loss of density in your bones. This makes bones more brittle and fragile and more likely to break (fracture),even with minor falls.  The goal of treatment is to strengthen your bones and reduce your risk for a fracture.  Include calcium and vitamin D in your diet. Calcium is important for bone health, and vitamin D helps your body to absorb calcium.  Talk with your health care provider about screening for osteoporosis if you are a woman who is age 65 or older, or a man who is age 70 or older. This information is not intended to replace advice given to you by your health care provider. Make sure you discuss any questions you have with your health care provider. Document Revised: 05/29/2017 Document Reviewed: 04/10/2017 Elsevier Patient Education  2020 [employer]  

## 2020-03-01 NOTE — Progress Notes (Signed)
Subjective:    Patient ID: Rhonda Kelly, female    DOB: 1945/11/08, 74 y.o.   MRN: [id number]  Chief Complaint  Patient presents with  . Hypothyroidism    DISCUSS , cant do prolia due to dental implants   PT presents to the office today for chronic follow up.  Thyroid Problem Presents for follow-up visit. Symptoms include dry skin. Patient reports no anxiety, diarrhea or nail problem. The symptoms have been stable. Her past medical history is significant for hyperlipidemia.  Gastroesophageal Reflux She complains of belching and heartburn. This is a chronic problem. The current episode started more than 1 year ago. The problem occurs occasionally. She has tried a PPI for the symptoms. The treatment provided moderate relief.  Hyperlipidemia This is a chronic problem. The current episode started more than 1 year ago. The problem is uncontrolled. Recent lipid tests were reviewed and are high. Current antihyperlipidemic treatment includes statins. The current treatment provides moderate improvement of lipids. Risk factors for coronary artery disease include dyslipidemia, a sedentary lifestyle and post-menopausal.  Nicotine Dependence Presents for follow-up visit. Her urge triggers include company of smokers. The symptoms have been stable. She smokes < 1/2 a pack of cigarettes per day.  Osteoporosis  Pt had taken Prolia, but needed a dental implant and has been off a year. Last Dexa scan 07/09/2018.    Review of Systems  Gastrointestinal: Positive for heartburn. Negative for diarrhea.  Psychiatric/Behavioral: The patient is not nervous/anxious.   All other systems reviewed and are negative.      Objective:   Physical Exam Vitals reviewed.  Constitutional:      General: She is not in acute distress.    Appearance: She is well-developed.  HENT:     Head: Normocephalic and atraumatic.     Right Ear: Tympanic membrane normal.     Left Ear: Tympanic membrane normal.  Eyes:     Pupils:  Pupils are equal, round, and reactive to light.  Neck:     Thyroid: No thyromegaly.  Cardiovascular:     Rate and Rhythm: Normal rate and regular rhythm.     Heart sounds: Normal heart sounds. No murmur heard.   Pulmonary:     Effort: Pulmonary effort is normal. No respiratory distress.     Breath sounds: Normal breath sounds. No wheezing.  Abdominal:     General: Bowel sounds are normal. There is no distension.     Palpations: Abdomen is soft.     Tenderness: There is no abdominal tenderness.  Musculoskeletal:        General: No tenderness. Normal range of motion.     Cervical back: Normal range of motion and neck supple.  Skin:    General: Skin is warm and dry.  Neurological:     Mental Status: She is alert and oriented to person, place, and time.     Cranial Nerves: No cranial nerve deficit.     Deep Tendon Reflexes: Reflexes are normal and symmetric.  Psychiatric:        Behavior: Behavior normal.        Thought Content: Thought content normal.        Judgment: Judgment normal.       BP (!) 145/73   Pulse 78   Temp 97.8 F (36.6 C) (Temporal)   Ht 5' (1.524 m)   Wt 111 lb 12.8 oz (50.7 kg)   SpO2 97%   BMI 21.83 kg/m      Assessment &  Plan:  Rhonda Kelly comes in today with chief complaint of Hypothyroidism (DISCUSS , cant do prolia due to dental implants)   Diagnosis and orders addressed:  1. Hypothyroidism, unspecified type - levothyroxine (SYNTHROID) 50 MCG tablet; Take 1 tablet (50 mcg total) by mouth daily before breakfast. (Needs to be seen before next refill)  Dispense: 90 tablet; Refill: 0 - CMP14+EGFR - CBC with Differential/Platelet - TSH  2. Aortic atherosclerosis (HCC) - atorvastatin (LIPITOR) 20 MG tablet; TAKE ONE (1) TABLET EACH DAY  Dispense: 90 tablet; Refill: 4 - CMP14+EGFR - CBC with Differential/Platelet  3. Hyperlipidemia, unspecified hyperlipidemia type - atorvastatin (LIPITOR) 20 MG tablet; TAKE ONE (1) TABLET EACH DAY  Dispense:  90 tablet; Refill: 4 - CMP14+EGFR - CBC with Differential/Platelet  4. Gastroesophageal reflux disease, unspecified whether esophagitis present - CMP14+EGFR - CBC with Differential/Platelet  5. Osteoporosis, postmenopausal Will start Fosamax Encourage weight bearing exercises - CMP14+EGFR - CBC with Differential/Platelet - alendronate (FOSAMAX) 70 MG tablet; Take 1 tablet (70 mg total) by mouth every 7 (seven) days. Take with a full glass of water on an empty stomach.  Dispense: 12 tablet; Refill: 3  6. Tobacco abuse - CMP14+EGFR - CBC with Differential/Platelet   Labs pending Health Maintenance reviewed Diet and exercise encouraged  Follow up plan: 6 months    Evelina Dun, FNP

## 2020-03-02 LAB — CMP14+EGFR
ALT: 14 IU/L (ref 0–32)
AST: 26 IU/L (ref 0–40)
Albumin/Globulin Ratio: 1.6 (ref 1.2–2.2)
Albumin: 4.4 g/dL (ref 3.7–4.7)
Alkaline Phosphatase: 103 IU/L (ref 48–121)
BUN/Creatinine Ratio: 14 (ref 12–28)
BUN: 10 mg/dL (ref 8–27)
Bilirubin Total: 0.3 mg/dL (ref 0.0–1.2)
CO2: 24 mmol/L (ref 20–29)
Calcium: 9.9 mg/dL (ref 8.7–10.3)
Chloride: 103 mmol/L (ref 96–106)
Creatinine, Ser: 0.69 mg/dL (ref 0.57–1.00)
GFR calc Af Amer: 101 mL/min/{1.73_m2} (ref >59)
GFR calc non Af Amer: 88 mL/min/{1.73_m2} (ref >59)
Globulin, Total: 2.7 g/dL (ref 1.5–4.5)
Glucose: 90 mg/dL (ref 65–99)
Potassium: 4.4 mmol/L (ref 3.5–5.2)
Sodium: 142 mmol/L (ref 134–144)
Total Protein: 7.1 g/dL (ref 6.0–8.5)

## 2020-03-02 LAB — CBC WITH DIFFERENTIAL/PLATELET
Basophils Absolute: 0.1 10*3/uL (ref 0.0–0.2)
Basos: 1 %
EOS (ABSOLUTE): 0.1 10*3/uL (ref 0.0–0.4)
Eos: 2 %
Hematocrit: 44.4 % (ref 34.0–46.6)
Hemoglobin: 15 g/dL (ref 11.1–15.9)
Immature Grans (Abs): 0 10*3/uL (ref 0.0–0.1)
Immature Granulocytes: 0 %
Lymphocytes Absolute: 2.2 10*3/uL (ref 0.7–3.1)
Lymphs: 35 %
MCH: 29.9 pg (ref 26.6–33.0)
MCHC: 33.8 g/dL (ref 31.5–35.7)
MCV: 89 fL (ref 79–97)
Monocytes Absolute: 0.6 10*3/uL (ref 0.1–0.9)
Monocytes: 10 %
Neutrophils Absolute: 3.3 10*3/uL (ref 1.4–7.0)
Neutrophils: 52 %
Platelets: 229 10*3/uL (ref 150–450)
RBC: 5.01 x10E6/uL (ref 3.77–5.28)
RDW: 12.7 % (ref 11.7–15.4)
WBC: 6.4 10*3/uL (ref 3.4–10.8)

## 2020-03-02 LAB — TSH: TSH: 1.77 u[IU]/mL (ref 0.450–4.500)

## 2020-04-28 DIAGNOSIS — Z23 Encounter for immunization: Secondary | ICD-10-CM | POA: Diagnosis not present

## 2020-07-04 ENCOUNTER — Other Ambulatory Visit: Payer: Self-pay | Admitting: Family

## 2020-07-04 DIAGNOSIS — E039 Hypothyroidism, unspecified: Secondary | ICD-10-CM

## 2020-07-12 ENCOUNTER — Ambulatory Visit (INDEPENDENT_AMBULATORY_CARE_PROVIDER_SITE_OTHER): Payer: Medicare Other | Admitting: *Deleted

## 2020-07-12 DIAGNOSIS — Z0000 Encounter for general adult medical examination without abnormal findings: Secondary | ICD-10-CM

## 2020-07-12 NOTE — Patient Instructions (Signed)
  [facility] Maintenance Summary and Written Plan of Care  Ms. Woodyard ,  Thank you for allowing me to perform your Medicare Annual Wellness Visit and for your ongoing commitment to your health.   Health Maintenance & Immunization History Health Maintenance  Topic Date Due  . COLONOSCOPY (Pts 45-83yrs Insurance coverage will need to be confirmed)  10/08/2018  . DEXA SCAN  07/09/2020  . MAMMOGRAM  12/27/2021  . TETANUS/TDAP  09/19/2029  . INFLUENZA VACCINE  Completed  . COVID-19 Vaccine  Completed  . Hepatitis C Screening  Completed  . PNA vac Low Risk Adult  Completed   Immunization History  Administered Date(s) Administered  . Influenza, High Dose Seasonal PF 06/12/2016, 04/20/2017, 05/10/2018  . Influenza,inj,Quad PF,6+ Mos 05/03/2014, 04/10/2015, 05/01/2019  . Moderna Sars-Covid-2 Vaccination 07/21/2019, 08/26/2019, 04/28/2020  . Pneumococcal Conjugate-13 05/03/2014  . Pneumococcal Polysaccharide-23 07/31/2015  . Tdap 09/20/2019  . Zoster 02/05/2015  . Zoster Recombinat (Shingrix) 07/08/2017, 07/09/2018    These are the patient goals that we discussed: Goals Addressed            This Visit's Progress   . AWV       07/12/2020 AWV Goal: Fall Prevention  . Over the next year, patient will decrease their risk for falls by: o Using assistive devices, such as a cane or walker, as needed o Identifying fall risks within their home and correcting them by: - Removing throw rugs - Adding handrails to stairs or ramps - Removing clutter and keeping a clear pathway throughout the home - Increasing light, especially at night - Adding shower handles/bars - Raising toilet seat o Identifying potential personal risk factors for falls: - Medication side effects - Incontinence/urgency - Vestibular dysfunction - Hearing loss - Musculoskeletal disorders - Neurological disorders - Orthostatic hypotension          This is a list of Health Maintenance  Items that are overdue or due now: Health Maintenance Due  Topic Date Due  . COLONOSCOPY (Pts 45-36yrs Insurance coverage will need to be confirmed)  10/08/2018  . DEXA SCAN  07/09/2020     Orders/Referrals Placed Today: No orders of the defined types were placed in this encounter.  (Contact our referral department at [phone] if you have not spoken with someone about your referral appointment within the next 5 days)    Follow-up Plan . Follow-up with Sharion Balloon, FNP as planned . Schedule your colonoscopy . We will complete your dexa scan at your next appointment

## 2020-07-12 NOTE — Progress Notes (Addendum)
MEDICARE ANNUAL WELLNESS VISIT  07/12/2020  Telephone Visit Disclaimer This Medicare AWV was conducted by telephone due to national recommendations for restrictions regarding the COVID-19 Pandemic (e.g. social distancing).  I verified, using two identifiers, that I am speaking with Rhonda Kelly or their authorized healthcare agent. I discussed the limitations, risks, security, and privacy concerns of performing an evaluation and management service by telephone and the potential availability of an in-person appointment in the future. The patient expressed understanding and agreed to proceed.  Location of Patient: Home Location of Provider (nurse):  [facility]  Subjective:    Rhonda Kelly is a 75 y.o. female patient of [name], Theador Hawthorne, FNP who had a Medicare Annual Wellness Visit today via telephone. Allen is Retired and lives with her husband. She has a small yorkie in the home. She has 1 daughter.  she reports that she is socially active and does interact with friends/family regularly. she is minimally physically active and enjoys reading and gardening.  Patient Care Team: Sharion Balloon, FNP as PCP - General (Family Medicine) Harlen Labs, MD as Referring Physician (Optometry) Irene Shipper, MD as Consulting Physician (Gastroenterology)  Advanced Directives 07/12/2020 07/12/2019 07/09/2018 07/08/2017 07/08/2017 06/12/2016 09/24/2015  Does Patient Have a Medical Advance Directive? Yes Yes Yes - Yes Yes Yes  Type of Advance Directive [place];Living will [place];Living will [place];Living will - [place];Living will [place];Living will Living will  Does patient want to make changes to medical advance directive? No - Patient declined No - Patient declined No - Patient declined - - No - Patient declined -  Copy of [place] in Chart? No - copy  requested No - copy requested No - copy requested No - copy requested No - copy requested No - copy requested [facility] Utilization Over the Past 12 Months: # of hospitalizations or ER visits: 0 # of surgeries: 0  Review of Systems    Patient reports that her overall health is unchanged compared to last year.  History obtained from chart review and the patient  Patient Reported Readings (BP, Pulse, CBG, Weight, etc) none  Pain Assessment Pain : No/denies pain     Current Medications & Allergies (verified) Allergies as of 07/12/2020   No Known Allergies     Medication List       Accurate as of July 12, 2020  9:35 AM. If you have any questions, ask your nurse or doctor.        STOP taking these medications   ASCORBIC ACID PO   ZINC PO     TAKE these medications   alendronate 70 MG tablet Commonly known as: FOSAMAX Take 1 tablet (70 mg total) by mouth every 7 (seven) days. Take with a full glass of water on an empty stomach.   aspirin 81 MG tablet Take 81 mg by mouth daily.   atorvastatin 20 MG tablet Commonly known as: LIPITOR TAKE ONE (1) TABLET EACH DAY   BLACK ELDERBERRY PO Take by mouth.   Cholecalciferol 25 MCG (1000 UT) Tbdp Take 1,000 Int'l Units by mouth daily.   Co-Enzyme Q-10 100 MG Caps Take 100 mg by mouth daily.   KRILL OIL PO Take 1 capsule by mouth daily.   levothyroxine 50 MCG tablet Commonly known as: SYNTHROID TAKE ONE TABLET EACH MORNING BEFORE BREAKFAST   multivitamin-lutein  Caps capsule Take 1 capsule by mouth daily. icaps Areds 2   OVER THE COUNTER MEDICATION Vitamin c with zinc   Restasis 0.05 % ophthalmic emulsion Generic drug: cycloSPORINE   triamcinolone ointment 0.5 % Commonly known as: KENALOG Apply 1 application topically 2 (two) times daily.       History (reviewed): Past Medical History:  Diagnosis Date  . Allergy    some seasonal- otc meds prn only  . Bladder prolapse, female, acquired     surgery pending to repair 09-24-15  . Cancer ([place])    skin cancer  . Cataract    small  . Colon polyp   . GERD (gastroesophageal reflux disease)   . Hyperlipidemia   . Macular degeneration   . Osteoporosis   . Thyroid disease    25 years ago- no meds now    Past Surgical History:  Procedure Laterality Date  . ABDOMINAL HYSTERECTOMY Bilateral 1993   leiomyomata, bleeding  . COLONOSCOPY    . [place]   right  . MOUTH SURGERY     dental implants x 2  . OOPHORECTOMY     BSO  . POLYPECTOMY    . SQUAMOUS CELL CARCINOMA EXCISION Left 2018   SCC removed from upper left leg  . TONSILLECTOMY  1972  . TUBAL LIGATION     Family History  Problem Relation Age of Onset  . Colon cancer Mother 71  . Heart attack Mother        during gall bladder surgery  . Heart disease Mother   . Colon cancer Maternal Grandmother 77  . Cancer Father        prostate  . Diabetes Sister   . Colon polyps Sister   . Stroke Brother   . Heart attack Brother   . CAD Brother   . Heart attack Brother   . CAD Brother   . Diabetes Brother   . Colon cancer Maternal Aunt   . Colon cancer Maternal Uncle   . Liver disease Daughter   . Stomach cancer Neg Hx    Social History   Socioeconomic History  . Marital status: Married    Spouse name: Rhonda Kelly  . Number of children: 1  . Years of education: 20  . Highest education level: Associate degree: academic program  Occupational History  . Occupation: Retired    Fish farm manager: [employer]    Comment: Retired in 2009  Tobacco Use  . Smoking status: Current Every Day Smoker    Packs/day: 0.50    Years: 45.00    Pack years: 22.50    Types: Cigarettes  . Smokeless tobacco: Never Used  Vaping Use  . Vaping Use: Never used  Substance and Sexual Activity  . Alcohol use: Not Currently    Alcohol/week: 0.0 standard drinks  . Drug use: No  . Sexual activity: Yes    Birth control/protection: Surgical    Comment: 1st intercourse 75 yo-Fewer than 5 partners   Other Topics Concern  . Not on file  Social History Narrative  . Not on file   Social Determinants of Health   Financial Resource Strain: Not on file  Food Insecurity: Not on file  Transportation Needs: Not on file  Physical Activity: Not on file  Stress: Not on file  Social Connections: Not on file    Activities of Daily Living In your present state of health, do you have any difficulty performing the following activities: 07/12/2020  Hearing? N  Vision? N  Comment Wears glasses  Difficulty concentrating or making decisions? N  Walking or climbing stairs? N  Dressing or bathing? N  Doing errands, shopping? N  Preparing Food and eating ? N  Using the Toilet? N  In the past six months, have you accidently leaked urine? N  Do you have problems with loss of bowel control? N  Managing your Medications? N  Managing your Finances? N  Housekeeping or managing your Housekeeping? N  Some recent data might be hidden    Patient Education/ Literacy How often do you need to have someone help you when you read instructions, pamphlets, or other written materials from your doctor or pharmacy?: 1 - Never What is the last grade level you completed in school?: some college  Exercise Current Exercise Habits: The patient does not participate in regular exercise at present, Exercise limited by: None identified  Diet Patient reports consuming 2.5 meals a day and 2 snack(s) a day Patient reports that her primary diet is: Regular Patient reports that she does have regular access to food.   Depression Screen PHQ 2/9 Scores 07/12/2020 03/01/2020 09/20/2019 07/12/2019 09/14/2018 03/16/2018 01/19/2018  PHQ - 2 Score 0 0 0 0 0 0 0     Fall Risk Fall Risk  07/12/2020 03/01/2020 09/20/2019 07/12/2019 09/14/2018  Falls in the past year? 0 1 0 0 0  Number falls in past yr: - 0 - - -  Injury with Fall? - 1 - - -  Risk for fall due to : - Impaired mobility - - -     Objective:  Seylah A Rhein seemed alert  and oriented and she participated appropriately during our telephone visit.  Blood Pressure Weight BMI  BP Readings from Last 3 Encounters:  03/01/20 (!) 145/73  09/20/19 (!) 143/72  09/14/18 (!) 143/70   Wt Readings from Last 3 Encounters:  03/01/20 111 lb 12.8 oz (50.7 kg)  09/20/19 115 lb (52.2 kg)  09/14/18 118 lb 6.4 oz (53.7 kg)   BMI Readings from Last 1 Encounters:  03/01/20 21.83 kg/m    *Unable to obtain current vital signs, weight, and BMI due to telephone visit type  Hearing/Vision  . Deona did not seem to have difficulty with hearing/understanding during the telephone conversation . Reports that she has had a formal eye exam by an eye care professional within the past year . Reports that she has not had a formal hearing evaluation within the past year *Unable to fully assess hearing and vision during telephone visit type  Cognitive Function: 6CIT Screen 07/12/2020 07/12/2019 06/12/2016  What Year? 0 points 0 points 0 points  What month? 0 points 0 points 0 points  What time? 0 points 0 points 0 points  Count back from 20 0 points 0 points 0 points  Months in reverse 0 points 0 points 0 points  Repeat phrase 0 points 0 points 0 points  Total Score 0 0 0   (Normal:0-7, Significant for Dysfunction: >8)  Normal Cognitive Function Screening: Yes   Immunization & Health Maintenance Record Immunization History  Administered Date(s) Administered  . Influenza, High Dose Seasonal PF 06/12/2016, 04/20/2017, 05/10/2018  . Influenza,inj,Quad PF,6+ Mos 05/03/2014, 04/10/2015, 05/01/2019  . Moderna Sars-Covid-2 Vaccination 07/21/2019, 08/26/2019, 04/28/2020  . Pneumococcal Conjugate-13 05/03/2014  . Pneumococcal Polysaccharide-23 07/31/2015  . Tdap 09/20/2019  . Zoster 02/05/2015  . Zoster Recombinat (Shingrix) 07/08/2017, 07/09/2018    Health Maintenance  Topic Date Due  . COLONOSCOPY (Pts 45-70yrs Insurance coverage will need to  be confirmed)  10/08/2018  . DEXA SCAN   07/09/2020  . MAMMOGRAM  12/27/2021  . TETANUS/TDAP  09/19/2029  . INFLUENZA VACCINE  Completed  . COVID-19 Vaccine  Completed  . Hepatitis C Screening  Completed  . PNA vac Low Risk Adult  Completed       Assessment  This is a routine wellness examination for Pasqualina Colasurdo Misner.  Health Maintenance: Due or Overdue Health Maintenance Due  Topic Date Due  . COLONOSCOPY (Pts 45-36yrs Insurance coverage will need to be confirmed)  10/08/2018  . DEXA SCAN  07/09/2020    Rhonda Skill Rowser does not need a referral for Community Assistance: Care Management:   no Social Work:    no Prescription Assistance:  no Nutrition/Diabetes Education:  no   Plan:  Personalized Goals Goals Addressed            This Visit's Progress   . AWV       07/12/2020 AWV Goal: Fall Prevention  . Over the next year, patient will decrease their risk for falls by: o Using assistive devices, such as a cane or walker, as needed o Identifying fall risks within their home and correcting them by: - Removing throw rugs - Adding handrails to stairs or ramps - Removing clutter and keeping a clear pathway throughout the home - Increasing light, especially at night - Adding shower handles/bars - Raising toilet seat o Identifying potential personal risk factors for falls: - Medication side effects - Incontinence/urgency - Vestibular dysfunction - Hearing loss - Musculoskeletal disorders - Neurological disorders - Orthostatic hypotension        Personalized Health Maintenance & Screening Recommendations  Bone densitometry screening Colorectal cancer screening  Lung Cancer Screening Recommended: no (Low Dose CT Chest recommended if Age 2-80 years, 30 pack-year currently smoking OR have quit w/in past 15 years) Hepatitis C Screening recommended: no HIV Screening recommended: no  Advanced Directives: Written information was not prepared per patient's request.  Referrals & Orders No orders of the defined  types were placed in this encounter.   Follow-up Plan . Follow-up with Sharion Balloon, FNP as planned . Schedule your colonoscopy . We will complete your dexa scan at your next appointment  AVS printed and mailed   I have personally reviewed and noted the following in the patient's chart:   . Medical and social history . Use of alcohol, tobacco or illicit drugs  . Current medications and supplements . Functional ability and status . Nutritional status . Physical activity . Advanced directives . List of other physicians . Hospitalizations, surgeries, and ER visits in previous 12 months . Vitals . Screenings to include cognitive, depression, and falls . Referrals and appointments  In addition, I have reviewed and discussed with Denice Paradise A Lucks certain preventive protocols, quality metrics, and best practice recommendations. A written personalized care plan for preventive services as well as general preventive health recommendations is available and can be mailed to the patient at her request.      Lynnea Ferrier, LPN  4/43/1540

## 2020-08-28 ENCOUNTER — Ambulatory Visit (INDEPENDENT_AMBULATORY_CARE_PROVIDER_SITE_OTHER): Payer: Medicare Other

## 2020-08-28 ENCOUNTER — Encounter: Payer: Self-pay | Admitting: Family

## 2020-08-28 ENCOUNTER — Other Ambulatory Visit: Payer: Self-pay

## 2020-08-28 ENCOUNTER — Ambulatory Visit (INDEPENDENT_AMBULATORY_CARE_PROVIDER_SITE_OTHER): Payer: Medicare Other | Admitting: Family

## 2020-08-28 VITALS — BP 127/68 | HR 61 | Temp 97.4°F | Ht 60.0 in | Wt 114.4 lb

## 2020-08-28 DIAGNOSIS — E039 Hypothyroidism, unspecified: Secondary | ICD-10-CM | POA: Diagnosis not present

## 2020-08-28 DIAGNOSIS — F17200 Nicotine dependence, unspecified, uncomplicated: Secondary | ICD-10-CM | POA: Diagnosis not present

## 2020-08-28 DIAGNOSIS — I700 Atherosclerosis of aorta: Secondary | ICD-10-CM | POA: Diagnosis not present

## 2020-08-28 DIAGNOSIS — M8588 Other specified disorders of bone density and structure, other site: Secondary | ICD-10-CM | POA: Diagnosis not present

## 2020-08-28 DIAGNOSIS — E785 Hyperlipidemia, unspecified: Secondary | ICD-10-CM | POA: Diagnosis not present

## 2020-08-28 DIAGNOSIS — M810 Age-related osteoporosis without current pathological fracture: Secondary | ICD-10-CM | POA: Diagnosis not present

## 2020-08-28 DIAGNOSIS — Z780 Asymptomatic menopausal state: Secondary | ICD-10-CM | POA: Diagnosis not present

## 2020-08-28 DIAGNOSIS — Z1211 Encounter for screening for malignant neoplasm of colon: Secondary | ICD-10-CM | POA: Diagnosis not present

## 2020-08-28 MED ORDER — LEVOTHYROXINE SODIUM 50 MCG PO TABS: ORAL_TABLET | ORAL | 2 refills | Status: DC

## 2020-08-28 MED ORDER — ALENDRONATE SODIUM 70 MG PO TABS: 70.0000 mg | ORAL_TABLET | ORAL | 3 refills | Status: DC

## 2020-08-28 MED ORDER — ATORVASTATIN CALCIUM 20 MG PO TABS: ORAL_TABLET | ORAL | 4 refills | Status: DC

## 2020-08-28 NOTE — Progress Notes (Signed)
Subjective:    Patient ID: Rhonda Kelly, female    DOB: October 07, 1945, 75 y.o.   MRN: [id number]  Chief Complaint  Patient presents with  . Hypothyroidism  . Gastroesophageal Reflux   PT presents to the office today for chronic follow up. Gastroesophageal Reflux She complains of belching and heartburn. This is a chronic problem. The current episode started more than 1 year ago. The problem occurs occasionally. The problem has been resolved. She has tried a PPI and a diet change for the symptoms. The treatment provided mild relief.  Thyroid Problem Presents for follow-up visit. Symptoms include dry skin. Patient reports no cold intolerance, depressed mood, diarrhea or heat intolerance. The symptoms have been stable. Her past medical history is significant for hyperlipidemia.  Hyperlipidemia This is a chronic problem. The current episode started more than 1 year ago. The problem is controlled. Recent lipid tests were reviewed and are normal. Current antihyperlipidemic treatment includes statins. The current treatment provides moderate improvement of lipids. Risk factors for coronary artery disease include dyslipidemia, hypertension, post-menopausal and a sedentary lifestyle.  Nicotine Dependence Presents for follow-up visit. Her urge triggers include company of smokers. The symptoms have been stable. She smokes < 1/2 a pack of cigarettes per day. Compliance with prior treatments has been good.  Osteoporosis  Taking Fosamax weekly. Last Dexascan 07/09/18.   Review of Systems  Gastrointestinal: Positive for heartburn. Negative for diarrhea.  Endocrine: Negative for cold intolerance and heat intolerance.  All other systems reviewed and are negative.      Objective:   Physical Exam Vitals reviewed.  Constitutional:      General: She is not in acute distress.    Appearance: She is well-developed and well-nourished.  HENT:     Head: Normocephalic and atraumatic.     Right Ear: Tympanic  membrane normal.     Left Ear: Tympanic membrane normal.     Mouth/Throat:     Mouth: Oropharynx is clear and moist.  Eyes:     Pupils: Pupils are equal, round, and reactive to light.  Neck:     Thyroid: No thyromegaly.  Cardiovascular:     Rate and Rhythm: Normal rate and regular rhythm.     Pulses: Intact distal pulses.     Heart sounds: Normal heart sounds. No murmur heard.   Pulmonary:     Effort: Pulmonary effort is normal. No respiratory distress.     Breath sounds: Normal breath sounds. No wheezing.  Abdominal:     General: Bowel sounds are normal. There is no distension.     Palpations: Abdomen is soft.     Tenderness: There is no abdominal tenderness.  Musculoskeletal:        General: No tenderness or edema. Normal range of motion.     Cervical back: Normal range of motion and neck supple.  Skin:    General: Skin is warm and dry.  Neurological:     Mental Status: She is alert and oriented to person, place, and time.     Cranial Nerves: No cranial nerve deficit.     Deep Tendon Reflexes: Reflexes are normal and symmetric.  Psychiatric:        Mood and Affect: Mood and affect normal.        Behavior: Behavior normal.        Thought Content: Thought content normal.        Judgment: Judgment normal.       BP 127/68 (BP Location: Left Arm, Cuff  Size: Normal)   Pulse 61   Temp (!) 97.4 F (36.3 C) (Temporal)   Ht 5' (1.524 m)   Wt 114 lb 6.4 oz (51.9 kg)   SpO2 98%   BMI 22.34 kg/m      Assessment & Plan:  Rhonda Kelly comes in today with chief complaint of Hypothyroidism and Gastroesophageal Reflux   Diagnosis and orders addressed:  1. Aortic atherosclerosis (HCC) - atorvastatin (LIPITOR) 20 MG tablet; TAKE ONE (1) TABLET EACH DAY  Dispense: 90 tablet; Refill: 4 - CMP14+EGFR - CBC with Differential/Platelet  2. Hyperlipidemia, unspecified hyperlipidemia type - atorvastatin (LIPITOR) 20 MG tablet; TAKE ONE (1) TABLET EACH DAY  Dispense: 90 tablet;  Refill: 4 - CMP14+EGFR - CBC with Differential/Platelet - Lipid panel  3. Hypothyroidism, unspecified type - levothyroxine (SYNTHROID) 50 MCG tablet; TAKE ONE TABLET EACH MORNING BEFORE BREAKFAST  Dispense: 90 tablet; Refill: 2 - CMP14+EGFR - TSH - CBC with Differential/Platelet  4. Osteoporosis, postmenopausal - alendronate (FOSAMAX) 70 MG tablet; Take 1 tablet (70 mg total) by mouth every 7 (seven) days. Take with a full glass of water on an empty stomach.  Dispense: 12 tablet; Refill: 3 - [facility] DEXA; Future - CMP14+EGFR - CBC with Differential/Platelet  5. Colon cancer screening - Ambulatory referral to Gastroenterology - CMP14+EGFR - CBC with Differential/Platelet  6. Current smoker - CMP14+EGFR - CBC with Differential/Platelet   Labs pending Health Maintenance reviewed Diet and exercise encouraged  Follow up plan: 6 months    Evelina Dun, FNP

## 2020-08-28 NOTE — Patient Instructions (Signed)
Hypothyroidism  Hypothyroidism is when the thyroid gland does not make enough of certain hormones (it is underactive). The thyroid gland is a small gland located in the lower front part of the neck, just in front of the windpipe (trachea). This gland makes hormones that help control how the body uses food for energy (metabolism) as well as how the heart and brain function. These hormones also play a role in keeping your bones strong. When the thyroid is underactive, it produces too little of the hormones thyroxine (T4) and triiodothyronine (T3). What are the causes? This condition may be caused by:  Hashimoto's disease. This is a disease in which the body's disease-fighting system (immune system) attacks the thyroid gland. This is the most common cause.  Viral infections.  Pregnancy.  Certain medicines.  Birth defects.  Past radiation treatments to the head or neck for cancer.  Past treatment with radioactive iodine.  Past exposure to radiation in the environment.  Past surgical removal of part or all of the thyroid.  Problems with a gland in the center of the brain (pituitary gland).  Lack of enough iodine in the diet. What increases the risk? You are more likely to develop this condition if:  You are female.  You have a family history of thyroid conditions.  You use a medicine called lithium.  You take medicines that affect the immune system (immunosuppressants). What are the signs or symptoms? Symptoms of this condition include:  Feeling as though you have no energy (lethargy).  Not being able to tolerate cold.  Weight gain that is not explained by a change in diet or exercise habits.  Lack of appetite.  Dry skin.  Coarse hair.  Menstrual irregularity.  Slowing of thought processes.  Constipation.  Sadness or depression. How is this diagnosed? This condition may be diagnosed based on:  Your symptoms, your medical history, and a physical exam.  Blood  tests. You may also have imaging tests, such as an ultrasound or MRI. How is this treated? This condition is treated with medicine that replaces the thyroid hormones that your body does not make. After you begin treatment, it may take several weeks for symptoms to go away. Follow these instructions at home:  Take over-the-counter and prescription medicines only as told by your health care provider.  If you start taking any new medicines, tell your health care provider.  Keep all follow-up visits as told by your health care provider. This is important. ? As your condition improves, your dosage of thyroid hormone medicine may change. ? You will need to have blood tests regularly so that your health care provider can monitor your condition. Contact a health care provider if:  Your symptoms do not get better with treatment.  You are taking thyroid hormone replacement medicine and you: ? Sweat a lot. ? Have tremors. ? Feel anxious. ? Lose weight rapidly. ? Cannot tolerate heat. ? Have emotional swings. ? Have diarrhea. ? Feel weak. Get help right away if you have:  Chest pain.  An irregular heartbeat.  A rapid heartbeat.  Difficulty breathing. Summary  Hypothyroidism is when the thyroid gland does not make enough of certain hormones (it is underactive).  When the thyroid is underactive, it produces too little of the hormones thyroxine (T4) and triiodothyronine (T3).  The most common cause is Hashimoto's disease, a disease in which the body's disease-fighting system (immune system) attacks the thyroid gland. The condition can also be caused by viral infections, medicine, pregnancy, or   past radiation treatment to the head or neck.  Symptoms may include weight gain, dry skin, constipation, feeling as though you do not have energy, and not being able to tolerate cold.  This condition is treated with medicine to replace the thyroid hormones that your body does not make. This  information is not intended to replace advice given to you by your health care provider. Make sure you discuss any questions you have with your health care provider. Document Revised: 03/16/2020 Document Reviewed: 03/01/2020 Elsevier Patient Education  2021 [employer]  

## 2020-08-29 LAB — CBC WITH DIFFERENTIAL/PLATELET
Basophils Absolute: 0 10*3/uL (ref 0.0–0.2)
Basos: 1 %
EOS (ABSOLUTE): 0.2 10*3/uL (ref 0.0–0.4)
Eos: 3 %
Hematocrit: 41.6 % (ref 34.0–46.6)
Hemoglobin: 14 g/dL (ref 11.1–15.9)
Immature Grans (Abs): 0 10*3/uL (ref 0.0–0.1)
Immature Granulocytes: 0 %
Lymphocytes Absolute: 2 10*3/uL (ref 0.7–3.1)
Lymphs: 35 %
MCH: 29.7 pg (ref 26.6–33.0)
MCHC: 33.7 g/dL (ref 31.5–35.7)
MCV: 88 fL (ref 79–97)
Monocytes Absolute: 0.5 10*3/uL (ref 0.1–0.9)
Monocytes: 8 %
Neutrophils Absolute: 2.9 10*3/uL (ref 1.4–7.0)
Neutrophils: 53 %
Platelets: 194 10*3/uL (ref 150–450)
RBC: 4.71 x10E6/uL (ref 3.77–5.28)
RDW: 13.3 % (ref 11.7–15.4)
WBC: 5.6 10*3/uL (ref 3.4–10.8)

## 2020-08-29 LAB — CMP14+EGFR
ALT: 20 IU/L (ref 0–32)
AST: 27 IU/L (ref 0–40)
Albumin/Globulin Ratio: 2.1 (ref 1.2–2.2)
Albumin: 4.5 g/dL (ref 3.7–4.7)
Alkaline Phosphatase: 81 IU/L (ref 44–121)
BUN/Creatinine Ratio: 12 (ref 12–28)
BUN: 8 mg/dL (ref 8–27)
Bilirubin Total: 0.3 mg/dL (ref 0.0–1.2)
CO2: 21 mmol/L (ref 20–29)
Calcium: 9.9 mg/dL (ref 8.7–10.3)
Chloride: 106 mmol/L (ref 96–106)
Creatinine, Ser: 0.68 mg/dL (ref 0.57–1.00)
Globulin, Total: 2.1 g/dL (ref 1.5–4.5)
Glucose: 101 mg/dL — ABNORMAL HIGH (ref 65–99)
Potassium: 4.3 mmol/L (ref 3.5–5.2)
Sodium: 142 mmol/L (ref 134–144)
Total Protein: 6.6 g/dL (ref 6.0–8.5)
eGFR: 92 mL/min/{1.73_m2} (ref >59)

## 2020-08-29 LAB — LIPID PANEL
Chol/HDL Ratio: 3.1 ratio (ref 0.0–4.4)
Cholesterol, Total: 201 mg/dL — ABNORMAL HIGH (ref 100–199)
HDL: 64 mg/dL (ref >39)
LDL Chol Calc (NIH): 120 mg/dL — ABNORMAL HIGH (ref 0–99)
Triglycerides: 93 mg/dL (ref 0–149)
VLDL Cholesterol Cal: 17 mg/dL (ref 5–40)

## 2020-08-29 LAB — TSH: TSH: 3.59 u[IU]/mL (ref 0.450–4.500)

## 2020-10-08 ENCOUNTER — Other Ambulatory Visit (HOSPITAL_COMMUNITY): Payer: Self-pay

## 2020-10-08 DIAGNOSIS — Z122 Encounter for screening for malignant neoplasm of respiratory organs: Secondary | ICD-10-CM

## 2020-10-08 DIAGNOSIS — Z87891 Personal history of nicotine dependence: Secondary | ICD-10-CM

## 2020-10-08 NOTE — Progress Notes (Signed)
Follow-up LDCT scheduled for 11/07/20 at 1600.

## 2020-10-15 ENCOUNTER — Other Ambulatory Visit: Payer: Self-pay

## 2020-10-15 ENCOUNTER — Encounter: Payer: Self-pay | Admitting: Internal Medicine

## 2020-10-15 ENCOUNTER — Ambulatory Visit (INDEPENDENT_AMBULATORY_CARE_PROVIDER_SITE_OTHER): Payer: Medicare Other | Admitting: Internal Medicine

## 2020-10-15 VITALS — BP 146/68 | HR 66 | Ht 60.75 in | Wt 112.8 lb

## 2020-10-15 DIAGNOSIS — K5901 Slow transit constipation: Secondary | ICD-10-CM | POA: Diagnosis not present

## 2020-10-15 DIAGNOSIS — R1012 Left upper quadrant pain: Secondary | ICD-10-CM

## 2020-10-15 DIAGNOSIS — Z86010 Personal history of colonic polyps: Secondary | ICD-10-CM | POA: Diagnosis not present

## 2020-10-15 MED ORDER — SUTAB 1479-225-188 MG PO TABS: 1.0000 | ORAL_TABLET | Freq: Once | ORAL | 0 refills | Status: AC

## 2020-10-15 NOTE — Patient Instructions (Signed)
You have been scheduled for a colonoscopy. Please follow written instructions given to you at your visit today.  Please pick up your prep supplies at the pharmacy within the next 1-3 days. If you use inhalers (even only as needed), please bring them with you on the day of your procedure.   

## 2020-10-15 NOTE — Progress Notes (Signed)
HISTORY OF PRESENT ILLNESS:  Rhonda Kelly is a 75 y.o. female with past medical history as listed below who presents today regarding left upper quadrant pain associated with constipation and the need for surveillance colonoscopy.  The patient has a personal history of multiple adenomatous colon polyps as well as a family history of colon cancer in her parent (78) and 2 aunts.  She has undergone colonoscopy in 2009, 2013, and most recently April 2016.  In addition to diverticulosis.  She was found to have 3 diminutive polyps on her most recent examination which were removed (tubular adenomas).  Follow-up in 3 years recommended.  Patient tells me that she has had intermittent problems with upper quadrant discomfort associated with constipation.  Relieved with good bowel movement.  She generally has several bowel movements per day on she keeps.  She denies nausea, vomiting, unexplained weight loss, or GI bleeding.  The discomfort is not progressive.  Review of blood work from August 28, 2020 shows normal comprehensive metabolic panel.  Normal CBC with hemoglobin 14.0.  Normal TSH 3.59.  She has completed her COVID vaccination series.  REVIEW OF SYSTEMS:  All non-GI ROS negative unless otherwise stated in the HPI except for cough  Past Medical History:  Diagnosis Date  . Allergy    some seasonal- otc meds prn only  . Bladder prolapse, female, acquired    surgery pending to repair 09-24-15  . Cancer ([facility])    skin cancer  . Cataract    small  . Colon polyp   . Colon polyp   . Diverticulosis   . GERD (gastroesophageal reflux disease)   . Hyperlipidemia   . Macular degeneration   . Osteoporosis   . Thyroid disease    25 years ago- no meds now     Past Surgical History:  Procedure Laterality Date  . ABDOMINAL HYSTERECTOMY Bilateral 1993   leiomyomata, bleeding  . COLONOSCOPY    . [place]   right  . MOUTH SURGERY     dental implants x 2  . OOPHORECTOMY     BSO  . POLYPECTOMY     . SQUAMOUS CELL CARCINOMA EXCISION Left 2018   SCC removed from upper left leg  . TONSILLECTOMY  1972  . TUBAL LIGATION      Social History Rhonda Kelly  reports that she has been smoking cigarettes. She has a 22.50 pack-year smoking history. She has never used smokeless tobacco. She reports previous alcohol use. She reports that she does not use drugs.  family history includes CAD in her brother and brother; Cancer in her father; Colon cancer in her maternal aunt and maternal uncle; Colon cancer (age of onset: 46) in her maternal grandmother; Colon cancer (age of onset: 42) in her mother; Colon polyps in her sister; Diabetes in her brother and sister; Heart attack in her brother, brother, and mother; Heart disease in her mother; Liver disease in her daughter; Stroke in her brother.  No Known Allergies     PHYSICAL EXAMINATION: Vital signs: BP (!) 146/68   Pulse 66   Ht 5' 0.75" (1.543 m)   Wt 112 lb 12.8 oz (51.2 kg)   SpO2 98%   BMI 21.49 kg/m   Constitutional: generally well-appearing, no acute distress Psychiatric: alert and oriented x3, cooperative Eyes: extraocular movements intact, anicteric, conjunctiva pink Mouth: oral pharynx moist, no lesions Neck: supple no lymphadenopathy Cardiovascular: heart regular rate and rhythm, no murmur Lungs: clear to auscultation bilaterally Abdomen:  soft, nontender, nondistended, no obvious ascites, no peritoneal signs, normal bowel sounds, no organomegaly Rectal: Deferred until colonoscopy Extremities: no clubbing, cyanosis, or lower extremity edema bilaterally Skin: no lesions on visible extremities Neuro: No focal deficits.  Cranial nerves intact  ASSESSMENT:  1.  Functional constipation.  Occasional 2.  Left-sided abdominal discomfort associated with constipation 3.  History of multiple adenomatous colon polyps.  Last colonoscopy 2017.  Due for surveillance 4.  Diverticulosis without diverticulitis   PLAN:  1.  High-fiber  diet 2.  Plenty of water 3.  Stool softeners as needed 4.  Schedule surveillance colonoscopy.The nature of the procedure, as well as the risks, benefits, and alternatives were carefully and thoroughly reviewed with the patient. Ample time for discussion and questions allowed. The patient understood, was satisfied, and agreed to proceed.

## 2020-10-18 ENCOUNTER — Ambulatory Visit (AMBULATORY_SURGERY_CENTER): Payer: Medicare Other | Admitting: Internal Medicine

## 2020-10-18 ENCOUNTER — Encounter: Payer: Self-pay | Admitting: Internal Medicine

## 2020-10-18 ENCOUNTER — Other Ambulatory Visit: Payer: Self-pay

## 2020-10-18 VITALS — BP 139/61 | HR 72 | Temp 97.8°F | Resp 16 | Ht 60.0 in | Wt 112.0 lb

## 2020-10-18 DIAGNOSIS — Z86010 Personal history of colonic polyps: Secondary | ICD-10-CM | POA: Diagnosis not present

## 2020-10-18 DIAGNOSIS — D125 Benign neoplasm of sigmoid colon: Secondary | ICD-10-CM

## 2020-10-18 DIAGNOSIS — K635 Polyp of colon: Secondary | ICD-10-CM | POA: Diagnosis not present

## 2020-10-18 MED ORDER — SODIUM CHLORIDE 0.9 % IV SOLN
500.0000 mL | Freq: Once | INTRAVENOUS | Status: DC
Start: 2020-10-18 — End: 2020-10-18

## 2020-10-18 NOTE — Progress Notes (Signed)
Pt Drowsy. VSS. To PACU, report to RN. No anesthetic complications noted.  

## 2020-10-18 NOTE — Op Note (Signed)
[facility] Patient Name: Rhonda Kelly Procedure Date: 10/18/2020 10:32 AM MRN: [id number] Endoscopist: Docia Chuck. Henrene Pastor , MD Age: 75 Referring MD:  Date of Birth: Jul 07, 1945 Gender: Female Account #: [id number] Procedure:                Colonoscopy with cold snare polypectomy x 1 Indications:              High risk colon cancer surveillance: Personal                            history of multiple (3 or more) adenomas, Last                            colonoscopy: April 2017. Other examinations 2009,                            2013. Family history of colon cancer in parent (age                            62); and 2 aunts Medicines:                Monitored Anesthesia Care Procedure:                Pre-Anesthesia Assessment:                           - Prior to the procedure, a History and Physical                            was performed, and patient medications and                            allergies were reviewed. The patient's tolerance of                            previous anesthesia was also reviewed. The risks                            and benefits of the procedure and the sedation                            options and risks were discussed with the patient.                            All questions were answered, and informed consent                            was obtained. Prior Anticoagulants: The patient has                            taken no previous anticoagulant or antiplatelet                            agents. ASA Grade Assessment: II - A patient with  mild systemic disease. After reviewing the risks                            and benefits, the patient was deemed in                            satisfactory condition to undergo the procedure.                           After obtaining informed consent, the colonoscope                            was passed under direct vision. Throughout the                            procedure, the patient's  blood pressure, pulse, and                            oxygen saturations were monitored continuously. The                            Olympus CF-HQ190 [id number]) Colonoscope was                            introduced through the anus and advanced to the the                            cecum, identified by appendiceal orifice and                            ileocecal valve. The ileocecal valve, appendiceal                            orifice, and rectum were photographed. The quality                            of the bowel preparation was excellent. The                            colonoscopy was performed without difficulty. The                            patient tolerated the procedure well. The bowel                            preparation used was SUPREP/tablets via split dose                            instruction. Scope In: 10:45:55 AM Scope Out: 10:59:11 AM Scope Withdrawal Time: 0 hours 9 minutes 52 seconds  Total Procedure Duration: 0 hours 13 minutes 16 seconds  Findings:                 A 1 mm polyp was found in the sigmoid colon. The  polyp was removed with a cold snare. Resection and                            retrieval were complete.                           Multiple diverticula were found in the left colon.                           The exam was otherwise without abnormality on                            direct and retroflexion views. Complications:            No immediate complications. Estimated blood loss:                            None. Estimated Blood Loss:     Estimated blood loss: none. Impression:               - One 1 mm polyp in the sigmoid colon, removed with                            a cold snare. Resected and retrieved.                           - Diverticulosis in the left colon.                           - The examination was otherwise normal on direct                            and retroflexion views. Recommendation:           - Repeat  colonoscopy in 5 years for surveillance                            (personal history of multiple polyps).                           - Patient has a contact number available for                            emergencies. The signs and symptoms of potential                            delayed complications were discussed with the                            patient. Return to normal activities tomorrow.                            Written discharge instructions were provided to the                            patient.                           -  Resume previous diet.                           - Continue present medications.                           - Await pathology results. Docia Chuck. Henrene Pastor, MD 10/18/2020 11:04:11 AM This report has been signed electronically.

## 2020-10-18 NOTE — Progress Notes (Signed)
Called to room to assist during endoscopic procedure.  Patient ID and intended procedure confirmed with present staff. Received instructions for my participation in the procedure from the performing physician.  

## 2020-10-18 NOTE — Patient Instructions (Signed)
Thank you for allowing [place] to care for you today!  Resume previous diet and medications today.  Return to your normal daily activities tomorrow, 10/19/2020.  Polyp results ready in 1-2 weeks.  Recommend surveillance colonoscopy in 5 years based on personal history of polyps.   YOU HAD AN ENDOSCOPIC PROCEDURE TODAY AT [facility]:   Refer to the procedure report that was given to you for any specific questions about what was found during the examination.  If the procedure report does not answer your questions, please call your gastroenterologist to clarify.  If you requested that your care partner not be given the details of your procedure findings, then the procedure report has been included in a sealed envelope for you to review at your convenience later.  YOU SHOULD EXPECT: Some feelings of bloating in the abdomen. Passage of more gas than usual.  Walking can help get rid of the air that was put into your GI tract during the procedure and reduce the bloating. If you had a lower endoscopy (such as a colonoscopy or flexible sigmoidoscopy) you may notice spotting of blood in your stool or on the toilet paper. If you underwent a bowel prep for your procedure, you may not have a normal bowel movement for a few days.  Please Note:  You might notice some irritation and congestion in your nose or some drainage.  This is from the oxygen used during your procedure.  There is no need for concern and it should clear up in a day or so.  SYMPTOMS TO REPORT IMMEDIATELY:   Following lower endoscopy (colonoscopy or flexible sigmoidoscopy):  Excessive amounts of blood in the stool  Significant tenderness or worsening of abdominal pains  Swelling of the abdomen that is new, acute  Fever of 100F or higher   For urgent or emergent issues, a gastroenterologist can be reached at any hour by calling [phone]. Do not use MyChart messaging for urgent concerns.    DIET:  We do recommend a  small meal at first, but then you may proceed to your regular diet.  Drink plenty of fluids but you should avoid alcoholic beverages for 24 hours.  ACTIVITY:  You should plan to take it easy for the rest of today and you should NOT DRIVE or use heavy machinery until tomorrow (because of the sedation medicines used during the test).    FOLLOW UP: Our staff will call the number listed on your records 48-72 hours following your procedure to check on you and address any questions or concerns that you may have regarding the information given to you following your procedure. If we do not reach you, we will leave a message.  We will attempt to reach you two times.  During this call, we will ask if you have developed any symptoms of COVID 19. If you develop any symptoms (ie: fever, flu-like symptoms, shortness of breath, cough etc.) before then, please call [phone].  If you test positive for Covid 19 in the 2 weeks post procedure, please call and report this information to [place].    If any biopsies were taken you will be contacted by phone or by letter within the next 1-3 weeks.  Please call us at [phone] if you have not heard about the biopsies in 3 weeks.    SIGNATURES/CONFIDENTIALITY: You and/or your care partner have signed paperwork which will be entered into your electronic medical record.  These signatures attest to the fact that that  the information above on your After Visit Summary has been reviewed and is understood.  Full responsibility of the confidentiality of this discharge information lies with you and/or your care-partner.

## 2020-10-22 ENCOUNTER — Telehealth: Payer: Self-pay | Admitting: *Deleted

## 2020-10-22 NOTE — Telephone Encounter (Signed)
  Follow up Call-  Call back number 10/18/2020  Post procedure Call Back phone  # [phone]  Permission to leave phone message Yes  Some recent data might be hidden     Patient questions:  Do you have a fever, pain , or abdominal swelling? No. Pain Score  0 *  Have you tolerated food without any problems? Yes Have you been able to return to your normal activities? Yes.    Do you have any questions about your discharge instructions: Diet   No. Medications  No. Follow up visit  No.  Do you have questions or concerns about your Care? No.  Actions: * If pain score is 4 or above: 1. No action needed, pain <4.Have you developed a fever since your procedure? no  2.   Have you had an respiratory symptoms (SOB or cough) since your procedure? no  3.   Have you tested positive for COVID 19 since your procedure no  4.   Have you had any family members/close contacts diagnosed with the COVID 19 since your procedure?  no   If yes to any of these questions please route to Joylene John, RN and Joella Prince, RN

## 2020-10-26 ENCOUNTER — Encounter: Payer: Self-pay | Admitting: Internal Medicine

## 2020-11-07 ENCOUNTER — Ambulatory Visit (HOSPITAL_COMMUNITY)
Admission: RE | Admit: 2020-11-07 | Discharge: 2020-11-07 | Disposition: A | Discharge status: Home / Self Care | Payer: Medicare Other | Source: Ambulatory Visit | Attending: Oncology | Admitting: Oncology

## 2020-11-07 ENCOUNTER — Other Ambulatory Visit: Payer: Self-pay

## 2020-11-07 DIAGNOSIS — Z122 Encounter for screening for malignant neoplasm of respiratory organs: Secondary | ICD-10-CM | POA: Diagnosis not present

## 2020-11-07 DIAGNOSIS — F17210 Nicotine dependence, cigarettes, uncomplicated: Secondary | ICD-10-CM | POA: Diagnosis not present

## 2020-11-07 DIAGNOSIS — Z87891 Personal history of nicotine dependence: Secondary | ICD-10-CM | POA: Diagnosis not present

## 2020-11-13 ENCOUNTER — Encounter (HOSPITAL_COMMUNITY): Payer: Self-pay

## 2020-11-13 NOTE — Progress Notes (Signed)
Patient notified of LDCT Lung Cancer Screening Results via mail with the recommendation to follow-up in 12 months. Patient's referring provider has been sent a copy of results. Results are as follows:  IMPRESSION: 1. Lung-RADS 2, benign appearance or behavior. Continue annual screening with low-dose chest CT without contrast in 12 months. 2. Left adrenal adenoma. 3. Aortic Atherosclerosis (ICD10-I70.0) and Emphysema (ICD10-J43.9). Coronary artery atherosclerosis

## 2021-06-05 ENCOUNTER — Other Ambulatory Visit: Payer: Self-pay | Admitting: Family

## 2021-06-05 DIAGNOSIS — M810 Age-related osteoporosis without current pathological fracture: Secondary | ICD-10-CM

## 2021-06-14 ENCOUNTER — Other Ambulatory Visit: Payer: Self-pay | Admitting: Family

## 2021-06-14 DIAGNOSIS — I700 Atherosclerosis of aorta: Secondary | ICD-10-CM

## 2021-06-14 DIAGNOSIS — E785 Hyperlipidemia, unspecified: Secondary | ICD-10-CM

## 2021-07-10 ENCOUNTER — Ambulatory Visit (INDEPENDENT_AMBULATORY_CARE_PROVIDER_SITE_OTHER): Payer: Medicare Other | Admitting: Family

## 2021-07-10 ENCOUNTER — Encounter: Payer: Self-pay | Admitting: Family

## 2021-07-10 VITALS — BP 141/62 | HR 70 | Temp 97.5°F | Ht 60.0 in | Wt 108.0 lb

## 2021-07-10 DIAGNOSIS — E039 Hypothyroidism, unspecified: Secondary | ICD-10-CM

## 2021-07-10 DIAGNOSIS — K219 Gastro-esophageal reflux disease without esophagitis: Secondary | ICD-10-CM

## 2021-07-10 DIAGNOSIS — Z720 Tobacco use: Secondary | ICD-10-CM

## 2021-07-10 DIAGNOSIS — E785 Hyperlipidemia, unspecified: Secondary | ICD-10-CM

## 2021-07-10 DIAGNOSIS — I700 Atherosclerosis of aorta: Secondary | ICD-10-CM

## 2021-07-10 DIAGNOSIS — M810 Age-related osteoporosis without current pathological fracture: Secondary | ICD-10-CM

## 2021-07-10 MED ORDER — ATORVASTATIN CALCIUM 20 MG PO TABS: 20.0000 mg | ORAL_TABLET | Freq: Every day | ORAL | 2 refills | Status: DC

## 2021-07-10 MED ORDER — TRIAMCINOLONE ACETONIDE 0.5 % EX OINT: 1.0000 "application " | TOPICAL_OINTMENT | Freq: Two times a day (BID) | CUTANEOUS | 0 refills | Status: DC

## 2021-07-10 MED ORDER — ALENDRONATE SODIUM 70 MG PO TABS: ORAL_TABLET | ORAL | 4 refills | Status: DC

## 2021-07-10 MED ORDER — LEVOTHYROXINE SODIUM 50 MCG PO TABS: ORAL_TABLET | ORAL | 2 refills | Status: DC

## 2021-07-10 NOTE — Progress Notes (Signed)
Subjective:    Patient ID: Rhonda Kelly, female    DOB: 07-12-1945, 76 y.o.   MRN: [id number]  Chief Complaint  Patient presents with   Medical Management of Chronic Issues   PT presents to the office today for chronic follow up. She has aortic atherosclerosis and takes Lipitor daily. She has osteoporosis and takes fosamax weekly. She is very active and cuts wood and has a wood burning fire place.  Gastroesophageal Reflux She complains of belching and heartburn. This is a chronic problem. The current episode started more than 1 year ago. The problem occurs rarely. The problem has been waxing and waning. She has tried a diet change for the symptoms. The treatment provided significant relief.  Thyroid Problem Presents for follow-up visit. Symptoms include dry skin. Patient reports no anxiety, constipation or diarrhea. Her past medical history is significant for hyperlipidemia.  Hyperlipidemia This is a chronic problem. The current episode started more than 1 year ago. The problem is controlled. She has no history of obesity. Current antihyperlipidemic treatment includes statins. The current treatment provides moderate improvement of lipids. Risk factors for coronary artery disease include dyslipidemia, a sedentary lifestyle and post-menopausal.  Nicotine Dependence Presents for follow-up visit. Her urge triggers include company of smokers. The symptoms have been stable. She smokes < 1/2 a pack of cigarettes per day.     Review of Systems  Gastrointestinal:  Positive for heartburn. Negative for constipation and diarrhea.  Psychiatric/Behavioral:  The patient is not nervous/anxious.   All other systems reviewed and are negative.     Objective:   Physical Exam Vitals reviewed.  Constitutional:      General: She is not in acute distress.    Appearance: She is well-developed.  HENT:     Head: Normocephalic and atraumatic.     Right Ear: Tympanic membrane normal.     Left Ear: Tympanic  membrane normal.  Eyes:     Pupils: Pupils are equal, round, and reactive to light.  Neck:     Thyroid: No thyromegaly.  Cardiovascular:     Rate and Rhythm: Normal rate and regular rhythm.     Heart sounds: Normal heart sounds. No murmur heard. Pulmonary:     Effort: Pulmonary effort is normal. No respiratory distress.     Breath sounds: Normal breath sounds. No wheezing.  Abdominal:     General: Bowel sounds are normal. There is no distension.     Palpations: Abdomen is soft.     Tenderness: There is no abdominal tenderness.  Musculoskeletal:        General: No tenderness. Normal range of motion.     Cervical back: Normal range of motion and neck supple.  Skin:    General: Skin is warm and dry.  Neurological:     Mental Status: She is alert and oriented to person, place, and time.     Cranial Nerves: No cranial nerve deficit.     Deep Tendon Reflexes: Reflexes are normal and symmetric.  Psychiatric:        Behavior: Behavior normal.        Thought Content: Thought content normal.        Judgment: Judgment normal.         BP (!) 141/62    Pulse 70    Temp (!) 97.5 F (36.4 C) (Temporal)    Ht 5' (1.524 m)    Wt 108 lb (49 kg)    BMI 21.09 kg/m   Assessment &  Plan:  Rhonda Kelly comes in today with chief complaint of Medical Management of Chronic Issues   Diagnosis and orders addressed:  1. Gastroesophageal reflux disease, unspecified whether esophagitis present - CMP14+EGFR - CBC with Differential/Platelet  2. Hyperlipidemia, unspecified hyperlipidemia type - atorvastatin (LIPITOR) 20 MG tablet; Take 1 tablet (20 mg total) by mouth daily. (NEEDS TO BE SEEN BEFORE NEXT REFILL)  Dispense: 90 tablet; Refill: 2 - CMP14+EGFR - CBC with Differential/Platelet - Lipid panel  3. Aortic atherosclerosis ([facility]CC) - atorvastatin (LIPITOR) 20 MG tablet; Take 1 tablet (20 mg total) by mouth daily. (NEEDS TO BE SEEN BEFORE NEXT REFILL)  Dispense: 90 tablet; Refill: 2 -  CMP14+EGFR - CBC with Differential/Platelet  4. Hypothyroidism, unspecified type - levothyroxine (SYNTHROID) 50 MCG tablet; TAKE ONE TABLET EACH MORNING BEFORE BREAKFAST  Dispense: 90 tablet; Refill: 2 - CMP14+EGFR - CBC with Differential/Platelet - Lipid panel - TSH  5. Osteoporosis, postmenopausal - alendronate (FOSAMAX) 70 MG tablet; TAKE 1 TABLET WEEKLY (TAKE WITH 8OZ OF WATER 30 MINUTES BEFORE BREAKFAST)  Dispense: 12 tablet; Refill: 4 - CMP14+EGFR - CBC with Differential/Platelet  6. Tobacco abuse - CMP14+EGFR - CBC with Differential/Platelet   Labs pending Health Maintenance reviewed Diet and exercise encouraged  Follow up plan: 6 months    Evelina Dun, FNP

## 2021-07-10 NOTE — Patient Instructions (Signed)
Health Maintenance After Age 74 After age 74, you are at a higher risk for certain long-term diseases and infections as well as injuries from falls. Falls are a major cause of broken bones and head injuries in people who are older than age 74. Getting regular preventive care can help to keep you healthy and well. Preventive care includes getting regular testing and making lifestyle changes as recommended by your health care provider. Talk with your health care provider about: Which screenings and tests you should have. A screening is a test that checks for a disease when you have no symptoms. A diet and exercise plan that is right for you. What should I know about screenings and tests to prevent falls? Screening and testing are the best ways to find a health problem early. Early diagnosis and treatment give you the best chance of managing medical conditions that are common after age 74. Certain conditions and lifestyle choices may make you more likely to have a fall. Your health care provider may recommend: Regular vision checks. Poor vision and conditions such as cataracts can make you more likely to have a fall. If you wear glasses, make sure to get your prescription updated if your vision changes. Medicine review. Work with your health care provider to regularly review all of the medicines you are taking, including over-the-counter medicines. Ask your health care provider about any side effects that may make you more likely to have a fall. Tell your health care provider if any medicines that you take make you feel dizzy or sleepy. Strength and balance checks. Your health care provider may recommend certain tests to check your strength and balance while standing, walking, or changing positions. Foot health exam. Foot pain and numbness, as well as not wearing proper footwear, can make you more likely to have a fall. Screenings, including: Osteoporosis screening. Osteoporosis is a condition that causes  the bones to get weaker and break more easily. Blood pressure screening. Blood pressure changes and medicines to control blood pressure can make you feel dizzy. Depression screening. You may be more likely to have a fall if you have a fear of falling, feel depressed, or feel unable to do activities that you used to do. Alcohol use screening. Using too much alcohol can affect your balance and may make you more likely to have a fall. Follow these instructions at home: Lifestyle Do not drink alcohol if: Your health care provider tells you not to drink. If you drink alcohol: Limit how much you have to: 0-1 drink a day for women. 0-2 drinks a day for men. Know how much alcohol is in your drink. In the [place] one drink equals one 12 oz bottle of beer (355 mL), one 5 oz glass of wine (148 mL), or one 1 oz glass of hard liquor (44 mL). Do not use any products that contain nicotine or tobacco. These products include cigarettes, chewing tobacco, and vaping devices, such as e-cigarettes. If you need help quitting, ask your health care provider. Activity  Follow a regular exercise program to stay fit. This will help you maintain your balance. Ask your health care provider what types of exercise are appropriate for you. If you need a cane or walker, use it as recommended by your health care provider. Wear supportive shoes that have nonskid soles. Safety  Remove any tripping hazards, such as rugs, cords, and clutter. Install safety equipment such as grab bars in bathrooms and safety rails on stairs. Keep rooms and walkways   well-lit. General instructions Talk with your health care provider about your risks for falling. Tell your health care provider if: You fall. Be sure to tell your health care provider about all falls, even ones that seem minor. You feel dizzy, tiredness (fatigue), or off-balance. Take over-the-counter and prescription medicines only as told by your health care provider. These include  supplements. Eat a healthy diet and maintain a healthy weight. A healthy diet includes low-fat dairy products, low-fat (lean) meats, and fiber from whole grains, beans, and lots of fruits and vegetables. Stay current with your vaccines. Schedule regular health, dental, and eye exams. Summary Having a healthy lifestyle and getting preventive care can help to protect your health and wellness after age 74. Screening and testing are the best way to find a health problem early and help you avoid having a fall. Early diagnosis and treatment give you the best chance for managing medical conditions that are more common for people who are older than age 74. Falls are a major cause of broken bones and head injuries in people who are older than age 74. Take precautions to prevent a fall at home. Work with your health care provider to learn what changes you can make to improve your health and wellness and to prevent falls. This information is not intended to replace advice given to you by your health care provider. Make sure you discuss any questions you have with your health care provider. Document Revised: 11/05/2020 Document Reviewed: 11/05/2020 Elsevier Patient Education  2022 [employer]  

## 2021-07-11 LAB — CBC WITH DIFFERENTIAL/PLATELET
Basophils Absolute: 0 10*3/uL (ref 0.0–0.2)
Basos: 1 %
EOS (ABSOLUTE): 0.1 10*3/uL (ref 0.0–0.4)
Eos: 2 %
Hematocrit: 42.4 % (ref 34.0–46.6)
Hemoglobin: 14 g/dL (ref 11.1–15.9)
Immature Grans (Abs): 0 10*3/uL (ref 0.0–0.1)
Immature Granulocytes: 0 %
Lymphocytes Absolute: 2.5 10*3/uL (ref 0.7–3.1)
Lymphs: 35 %
MCH: 29.1 pg (ref 26.6–33.0)
MCHC: 33 g/dL (ref 31.5–35.7)
MCV: 88 fL (ref 79–97)
Monocytes Absolute: 0.6 10*3/uL (ref 0.1–0.9)
Monocytes: 8 %
Neutrophils Absolute: 3.8 10*3/uL (ref 1.4–7.0)
Neutrophils: 54 %
Platelets: 244 10*3/uL (ref 150–450)
RBC: 4.81 x10E6/uL (ref 3.77–5.28)
RDW: 13 % (ref 11.7–15.4)
WBC: 7 10*3/uL (ref 3.4–10.8)

## 2021-07-11 LAB — CMP14+EGFR
ALT: 17 IU/L (ref 0–32)
AST: 24 IU/L (ref 0–40)
Albumin/Globulin Ratio: 1.9 (ref 1.2–2.2)
Albumin: 4.3 g/dL (ref 3.7–4.7)
Alkaline Phosphatase: 87 IU/L (ref 44–121)
BUN/Creatinine Ratio: 10 — ABNORMAL LOW (ref 12–28)
BUN: 7 mg/dL — ABNORMAL LOW (ref 8–27)
Bilirubin Total: 0.2 mg/dL (ref 0.0–1.2)
CO2: 22 mmol/L (ref 20–29)
Calcium: 9.7 mg/dL (ref 8.7–10.3)
Chloride: 103 mmol/L (ref 96–106)
Creatinine, Ser: 0.67 mg/dL (ref 0.57–1.00)
Globulin, Total: 2.3 g/dL (ref 1.5–4.5)
Glucose: 93 mg/dL (ref 70–99)
Potassium: 4.1 mmol/L (ref 3.5–5.2)
Sodium: 141 mmol/L (ref 134–144)
Total Protein: 6.6 g/dL (ref 6.0–8.5)
eGFR: 92 mL/min/{1.73_m2} (ref >59)

## 2021-07-11 LAB — LIPID PANEL
Chol/HDL Ratio: 3.3 ratio (ref 0.0–4.4)
Cholesterol, Total: 203 mg/dL — ABNORMAL HIGH (ref 100–199)
HDL: 62 mg/dL (ref >39)
LDL Chol Calc (NIH): 108 mg/dL — ABNORMAL HIGH (ref 0–99)
Triglycerides: 194 mg/dL — ABNORMAL HIGH (ref 0–149)
VLDL Cholesterol Cal: 33 mg/dL (ref 5–40)

## 2021-07-11 LAB — TSH: TSH: 2.12 u[IU]/mL (ref 0.450–4.500)

## 2021-07-15 ENCOUNTER — Ambulatory Visit (INDEPENDENT_AMBULATORY_CARE_PROVIDER_SITE_OTHER): Payer: Medicare Other

## 2021-07-15 VITALS — Ht 60.0 in | Wt 108.0 lb

## 2021-07-15 DIAGNOSIS — Z0000 Encounter for general adult medical examination without abnormal findings: Secondary | ICD-10-CM | POA: Diagnosis not present

## 2021-07-15 DIAGNOSIS — Z1231 Encounter for screening mammogram for malignant neoplasm of breast: Secondary | ICD-10-CM

## 2021-07-15 NOTE — Progress Notes (Signed)
Subjective:   Rhonda Kelly is a 76 y.o. female who presents for Medicare Annual (Subsequent) preventive examination.  Virtual Visit via Telephone Note  I connected with  Rhonda Kelly on 07/15/21 at  9:00 AM EST by telephone and verified that I am speaking with the correct person using two identifiers.  Location: Patient: Home Provider: [facility] Persons participating in the virtual visit: patient/Nurse Health Advisor   I discussed the limitations, risks, security and privacy concerns of performing an evaluation and management service by telephone and the availability of in person appointments. The patient expressed understanding and agreed to proceed.  Interactive audio and video telecommunications were attempted between this nurse and patient, however failed, due to patient having technical difficulties OR patient did not have access to video capability.  We continued and completed visit with audio only.  Some vital signs may be absent or patient reported.   Rhonda Kelly E Rhonda Dorval, LPN   Review of Systems     Cardiac Risk Factors include: advanced age (>73men, >20 women);dyslipidemia;smoking/ tobacco exposure;Other (see comment), Risk factor comments: atherosclerosis     Objective:    Today's Vitals   07/15/21 0905  Weight: 108 lb (49 kg)  Height: 5' (1.524 m)   Body mass index is 21.09 kg/m.  Advanced Directives 07/15/2021 07/12/2020 07/12/2019 07/09/2018 07/08/2017 07/08/2017 06/12/2016  Does Patient Have a Medical Advance Directive? No Yes Yes Yes - Yes Yes  Type of Advance Directive - [place];Living will [place];Living will [place];Living will - [place];Living will [place];Living will  Does patient want to make changes to medical advance directive? - No - Patient declined No - Patient declined No - Patient declined - - No - Patient declined  Copy of [place] in Chart?  - No - copy requested No - copy requested No - copy requested No - copy requested No - copy requested No - copy requested  Would patient like information on creating a medical advance directive? No - Patient declined - - - - - -    Current Medications (verified) Outpatient Encounter Medications as of 07/15/2021  Medication Sig   alendronate (FOSAMAX) 70 MG tablet TAKE 1 TABLET WEEKLY (TAKE WITH 8OZ OF WATER 30 MINUTES BEFORE BREAKFAST)   aspirin 81 MG tablet Take 81 mg by mouth daily.   atorvastatin (LIPITOR) 20 MG tablet Take 1 tablet (20 mg total) by mouth daily. (NEEDS TO BE SEEN BEFORE NEXT REFILL)   BLACK ELDERBERRY PO Take by mouth.   Cholecalciferol 1000 units TBDP Take 1,000 Int'l Units by mouth daily.   Co-Enzyme Q-10 100 MG CAPS Take 100 mg by mouth daily.   KRILL OIL PO Take 1 capsule by mouth daily.   levothyroxine (SYNTHROID) 50 MCG tablet TAKE ONE TABLET EACH MORNING BEFORE BREAKFAST   multivitamin-lutein (OCUVITE-LUTEIN) CAPS capsule Take 1 capsule by mouth daily. icaps Areds 2   OVER THE COUNTER MEDICATION Vitamin c with zinc   RESTASIS 0.05 % ophthalmic emulsion    triamcinolone ointment (KENALOG) 0.5 % Apply 1 application topically 2 (two) times daily.   No facility-administered encounter medications on file as of 07/15/2021.    Allergies (verified) Patient has no known allergies.   History: Past Medical History:  Diagnosis Date   Allergy    some seasonal- otc meds prn only   Bladder prolapse, female, acquired    surgery pending to repair 09-24-15   Cancer [facility])  skin cancer   Cataract    small   Colon polyp    Colon polyp    Diverticulosis    GERD (gastroesophageal reflux disease)    Hyperlipidemia    Macular degeneration    Osteoporosis    Thyroid disease    25 years ago- no meds now    Past Surgical History:  Procedure Laterality Date   ABDOMINAL HYSTERECTOMY Bilateral 1993   leiomyomata, bleeding   COLONOSCOPY     FOOT SURGERY  1977   right    MOUTH SURGERY     dental implants x 2   OOPHORECTOMY     BSO   POLYPECTOMY     SQUAMOUS CELL CARCINOMA EXCISION Left 2018   SCC removed from upper left leg   TONSILLECTOMY  1972   TUBAL LIGATION     Family History  Problem Relation Age of Onset   Colon cancer Mother 50   Heart attack Mother        during gall bladder surgery   Heart disease Mother    Colon cancer Maternal Grandmother 63   Cancer Father        prostate   Diabetes Sister    Colon polyps Sister    Stroke Brother    Heart attack Brother    CAD Brother    Heart attack Brother    CAD Brother    Diabetes Brother    Colon cancer Maternal Aunt    Colon cancer Maternal Uncle    Liver disease Daughter        States recent labs were normal   Stomach cancer Neg Hx    Esophageal cancer Neg Hx    Pancreatic cancer Neg Hx    Social History   Socioeconomic History   Marital status: Married    Spouse name: Gwyndolyn Saxon   Number of children: 1   Years of education: 14   Highest education level: Associate degree: academic program  Occupational History   Occupation: Retired    Fish farm manager: [employer]    Comment: Retired in 2009  Tobacco Use   Smoking status: Every Day    Packs/day: 0.50    Years: 45.00    Pack years: 22.50    Types: Cigarettes   Smokeless tobacco: Never  Vaping Use   Vaping Use: Never used  Substance and Sexual Activity   Alcohol use: Not Currently    Alcohol/week: 0.0 standard drinks   Drug use: No   Sexual activity: Yes    Birth control/protection: Surgical    Comment: 1st intercourse 76 yo-Fewer than 5 partners  Other Topics Concern   Not on file  Social History Narrative   Lives with her husband in a one level home [place] a farm - they stay busy doing farm work, Educational psychologist and carrying wood, Social research officer, government   Social Determinants of Radio broadcast assistant Strain: Low Risk    Difficulty of Paying Living Expenses: Not hard at all  Food Insecurity: No Food Insecurity   Worried About Charity fundraiser in the  Last Year: Never true   Arboriculturist in the Last Year: Never true  Transportation Needs: No Transportation Needs   Lack of Transportation (Medical): No   Lack of Transportation (Non-Medical): No  Physical Activity: Sufficiently Active   Days of Exercise per Week: 6 days   Minutes of Exercise per Session: 30 min  Stress: No Stress Concern Present   Feeling of Stress : Only a little  Social  Connections: Socially Integrated   Frequency of Communication with Friends and Family: More than three times a week   Frequency of Social Gatherings with Friends and Family: More than three times a week   Attends Religious Services: More than 4 times per year   Active Member of [employer] or Organizations: Yes   Attends Music therapist: More than 4 times per year   Marital Status: Married    Tobacco Counseling Ready to quit: Not Answered Counseling given: Not Answered   Clinical Intake:                 Diabetic? no         Activities of Daily Living In your present state of health, do you have any difficulty performing the following activities: 07/15/2021  Hearing? N  Vision? N  Difficulty concentrating or making decisions? N  Walking or climbing stairs? N  Dressing or bathing? N  Doing errands, shopping? N  Preparing Food and eating ? N  Using the Toilet? N  In the past six months, have you accidently leaked urine? N  Do you have problems with loss of bowel control? N  Managing your Medications? N  Managing your Finances? N  Housekeeping or managing your Housekeeping? N  Some recent data might be hidden    Patient Care Team: Sharion Balloon, FNP as PCP - General (Family Medicine) Harlen Labs, MD as Referring Physician (Optometry) Irene Shipper, MD as Consulting Physician (Gastroenterology)  Indicate any recent Medical Services you may have received from other than [facility] providers in the past year (date may be approximate).     Assessment:   This  is a routine wellness examination for Rhonda Kelly.  Hearing/Vision screen Hearing Screening - Comments:: Denies hearing difficulties  Vision Screening - Comments:: Wears rx glasses - up to date with annual eye exams with [facility]  Dietary issues and exercise activities discussed: Current Exercise Habits: Home exercise routine, Type of exercise: walking;Other - see comments (farm work, yard work, house work), Time (Minutes): 30, Frequency (Times/Week): 6, Weekly Exercise (Minutes/Week): 180, Intensity: Moderate, Exercise limited by: None identified   Goals Addressed             This Visit's Progress    AWV   On track    07/12/2020 AWV Goal: Fall Prevention  Over the next year, patient will decrease their risk for falls by: Using assistive devices, such as a cane or walker, as needed Identifying fall risks within their home and correcting them by: Removing throw rugs Adding handrails to stairs or ramps Removing clutter and keeping a clear pathway throughout the home Increasing light, especially at night Adding shower handles/bars Raising toilet seat Identifying potential personal risk factors for falls: Medication side effects Incontinence/urgency Vestibular dysfunction Hearing loss Musculoskeletal disorders Neurological disorders Orthostatic hypotension       Quit Smoking   Not on track    Use chantix and Nicorette to help quit smoking.         Depression Screen PHQ 2/9 Scores 07/15/2021 07/10/2021 08/28/2020 07/12/2020 03/01/2020 09/20/2019 07/12/2019  PHQ - 2 Score 1 0 0 0 0 0 0  PHQ- 9 Score 1 0 - - - - -    Fall Risk Fall Risk  07/15/2021 08/28/2020 07/12/2020 03/01/2020 09/20/2019  Falls in the past year? 0 0 0 1 0  Number falls in past yr: 0 - - 0 -  Injury with Fall? 0 - - 1 -  Risk for fall due to : No Fall Risks - - Impaired mobility -  Follow up Falls prevention discussed - - - -    FALL RISK PREVENTION PERTAINING TO THE HOME:  Any stairs in or around the home? No   If so, are there any without handrails? No  Home free of loose throw rugs in walkways, pet beds, electrical cords, etc? Yes  Adequate lighting in your home to reduce risk of falls? Yes   ASSISTIVE DEVICES UTILIZED TO PREVENT FALLS:  Life alert? No  Use of a cane, walker or w/c? No  Grab bars in the bathroom? No  Shower chair or bench in shower? No  Elevated toilet seat or a handicapped toilet? No   TIMED UP AND GO:  Was the test performed? No . Telephonic visit  Cognitive Function: Normal cognitive status assessed by direct observation by this Nurse Health Advisor. No abnormalities found.    MMSE - Mini Mental State Exam 07/08/2017 02/05/2015  Orientation to time 5 5  Orientation to Place 5 5  Registration 3 3  Attention/ Calculation 5 5  Recall 3 3  Language- name 2 objects 2 2  Language- repeat 1 1  Language- follow 3 step command 3 3  Language- read & follow direction 1 1  Write a sentence 1 1  Copy design 1 1  Total score 30 30     6CIT Screen 07/12/2020 07/12/2019 06/12/2016  What Year? 0 points 0 points 0 points  What month? 0 points 0 points 0 points  What time? 0 points 0 points 0 points  Count back from 20 0 points 0 points 0 points  Months in reverse 0 points 0 points 0 points  Repeat phrase 0 points 0 points 0 points  Total Score 0 0 0    Immunizations Immunization History  Administered Date(s) Administered   Influenza, High Dose Seasonal PF 06/12/2016, 04/20/2017, 05/10/2018   Influenza,inj,Quad PF,6+ Mos 05/03/2014, 04/10/2015, 05/01/2019   Influenza-Unspecified 04/29/2021   Moderna Sars-Covid-2 Vaccination 07/21/2019, 08/26/2019, 04/28/2020, 04/27/2021   Pneumococcal Conjugate-13 05/03/2014   Pneumococcal Polysaccharide-23 07/31/2015   Tdap 09/20/2019   Zoster Recombinat (Shingrix) 07/08/2017, 07/09/2018   Zoster, Live 02/05/2015    TDAP status: Up to date  Flu Vaccine status: Up to date  Pneumococcal vaccine status: Up to date  Covid-19  vaccine status: Completed vaccines  Qualifies for Shingles Vaccine? Yes   Zostavax completed Yes   Shingrix Completed?: Yes  Screening Tests Health Maintenance  Topic Date Due   COVID-19 Vaccine (5 - Booster for Moderna series) 07/26/2021 (Originally 06/22/2021)   MAMMOGRAM  07/10/2022 (Originally 12/27/2020)   DEXA SCAN  08/29/2022   COLONOSCOPY (Pts 45-78yrs Insurance coverage will need to be confirmed)  10/18/2025   TETANUS/TDAP  09/19/2029   Pneumonia Vaccine 81+ Years old  Completed   INFLUENZA VACCINE  Completed   Hepatitis C Screening  Completed   Zoster Vaccines- Shingrix  Completed   HPV VACCINES  Aged Out    Health Maintenance  There are no preventive care reminders to display for this patient.  Colorectal cancer screening: Type of screening: Colonoscopy. Completed 10/18/2020. Repeat every 5 years  Mammogram status: Ordered 07/15/2021. Pt provided with contact info and advised to call to schedule appt.   Bone Density status: Completed 08/28/2020. Results reflect: Bone density results: OSTEOPOROSIS. Repeat every 2 years.  Lung Cancer Screening: (Low Dose CT Chest recommended if Age 54-80 years, 30 pack-year currently smoking OR have quit w/in 15years.) does  qualify.   Lung Cancer Screening Referral: ordered 10/2020  Additional Screening:  Hepatitis C Screening: does qualify; Completed 02/05/2015  Vision Screening: Recommended annual ophthalmology exams for early detection of glaucoma and other disorders of the eye. Is the patient up to date with their annual eye exam?  Yes  Who is the provider or what is the name of the office in which the patient attends annual eye exams? [facility] If pt is not established with a provider, would they like to be referred to a provider to establish care? No .   Dental Screening: Recommended annual dental exams for proper oral hygiene  Community Resource Referral / Chronic Care Management: CRR required this visit?  No    CCM required this visit?  No      Plan:     I have personally reviewed and noted the following in the patients chart:   Medical and social history Use of alcohol, tobacco or illicit drugs  Current medications and supplements including opioid prescriptions.  Functional ability and status Nutritional status Physical activity Advanced directives List of other physicians Hospitalizations, surgeries, and ER visits in previous 12 months Vitals Screenings to include cognitive, depression, and falls Referrals and appointments  In addition, I have reviewed and discussed with patient certain preventive protocols, quality metrics, and best practice recommendations. A written personalized care plan for preventive services as well as general preventive health recommendations were provided to patient.     Sandrea Hammond, LPN   12/19/2977   Nurse Notes: None

## 2021-07-15 NOTE — Patient Instructions (Signed)
Rhonda Kelly , Thank you for taking time to come for your Medicare Wellness Visit. I appreciate your ongoing commitment to your health goals. Please review the following plan we discussed and let me know if I can assist you in the future.   Screening recommendations/referrals: Colonoscopy: Done 10/09/20 - Repeat in 5 years  Mammogram: Done 12/28/2019 - Repeat annually *due - ordered today - call for appointment for end of February Bone Density: Done 08/28/2020 - Repeat every 2 years  Recommended yearly ophthalmology/optometry visit for glaucoma screening and checkup Recommended yearly dental visit for hygiene and checkup  Vaccinations: Influenza vaccine: Done 04/29/2021 - Repeat annually  Pneumococcal vaccine: Done 05/03/2014 & 07/31/2015 Tdap vaccine: Done 09/20/2019 - Repeat in 10 years  Shingles vaccine: Done 07/08/2017 & 07/09/2018   Covid-19:Done 07/21/2019, 08/26/2019, 04/28/2020, & 04/27/2021  Advanced directives: Please bring a copy of your health care power of attorney and living will to the office to be added to your chart at your convenience.   Conditions/risks identified: Aim for 30 minutes of exercise or brisk walking each day, drink 6-8 glasses of water and eat lots of fruits and vegetables.   Next appointment: Follow up in one year for your annual wellness visit    Preventive Care 65 Years and Older, Female Preventive care refers to lifestyle choices and visits with your health care provider that can promote health and wellness. What does preventive care include? A yearly physical exam. This is also called an annual well check. Dental exams once or twice a year. Routine eye exams. Ask your health care provider how often you should have your eyes checked. Personal lifestyle choices, including: Daily care of your teeth and gums. Regular physical activity. Eating a healthy diet. Avoiding tobacco and drug use. Limiting alcohol use. Practicing safe sex. Taking low-dose aspirin every  day. Taking vitamin and mineral supplements as recommended by your health care provider. What happens during an annual well check? The services and screenings done by your health care provider during your annual well check will depend on your age, overall health, lifestyle risk factors, and family history of disease. Counseling  Your health care provider may ask you questions about your: Alcohol use. Tobacco use. Drug use. Emotional well-being. Home and relationship well-being. Sexual activity. Eating habits. History of falls. Memory and ability to understand (cognition). Work and work Statistician. Reproductive health. Screening  You may have the following tests or measurements: Height, weight, and BMI. Blood pressure. Lipid and cholesterol levels. These may be checked every 5 years, or more frequently if you are over 3 years old. Skin check. Lung cancer screening. You may have this screening every year starting at age 74 if you have a 30-pack-year history of smoking and currently smoke or have quit within the past 15 years. Fecal occult blood test (FOBT) of the stool. You may have this test every year starting at age 74. Flexible sigmoidoscopy or colonoscopy. You may have a sigmoidoscopy every 5 years or a colonoscopy every 10 years starting at age 74. Hepatitis C blood test. Hepatitis B blood test. Sexually transmitted disease (STD) testing. Diabetes screening. This is done by checking your blood sugar (glucose) after you have not eaten for a while (fasting). You may have this done every 1-3 years. Bone density scan. This is done to screen for osteoporosis. You may have this done starting at age 74. Mammogram. This may be done every 1-2 years. Talk to your health care provider about how often you should have  regular mammograms. Talk with your health care provider about your test results, treatment options, and if necessary, the need for more tests. Vaccines  Your health care  provider may recommend certain vaccines, such as: Influenza vaccine. This is recommended every year. Tetanus, diphtheria, and acellular pertussis (Tdap, Td) vaccine. You may need a Td booster every 10 years. Zoster vaccine. You may need this after age 74. Pneumococcal 13-valent conjugate (PCV13) vaccine. One dose is recommended after age 74. Pneumococcal polysaccharide (PPSV23) vaccine. One dose is recommended after age 74. Talk to your health care provider about which screenings and vaccines you need and how often you need them. This information is not intended to replace advice given to you by your health care provider. Make sure you discuss any questions you have with your health care provider. Document Released: 07/13/2015 Document Revised: 03/05/2016 Document Reviewed: 04/17/2015 Elsevier Interactive Patient Education  2017 [employer] Prevention in the Home Falls can cause injuries. They can happen to people of all ages. There are many things you can do to make your home safe and to help prevent falls. What can I do on the outside of my home? Regularly fix the edges of walkways and driveways and fix any cracks. Remove anything that might make you trip as you walk through a door, such as a raised step or threshold. Trim any bushes or trees on the path to your home. Use bright outdoor lighting. Clear any walking paths of anything that might make someone trip, such as rocks or tools. Regularly check to see if handrails are loose or broken. Make sure that both sides of any steps have handrails. Any raised decks and porches should have guardrails on the edges. Have any leaves, snow, or ice cleared regularly. Use sand or salt on walking paths during winter. Clean up any spills in your garage right away. This includes oil or grease spills. What can I do in the bathroom? Use night lights. Install grab bars by the toilet and in the tub and shower. Do not use towel bars as grab  bars. Use non-skid mats or decals in the tub or shower. If you need to sit down in the shower, use a plastic, non-slip stool. Keep the floor dry. Clean up any water that spills on the floor as soon as it happens. Remove soap buildup in the tub or shower regularly. Attach bath mats securely with double-sided non-slip rug tape. Do not have throw rugs and other things on the floor that can make you trip. What can I do in the bedroom? Use night lights. Make sure that you have a light by your bed that is easy to reach. Do not use any sheets or blankets that are too big for your bed. They should not hang down onto the floor. Have a firm chair that has side arms. You can use this for support while you get dressed. Do not have throw rugs and other things on the floor that can make you trip. What can I do in the kitchen? Clean up any spills right away. Avoid walking on wet floors. Keep items that you use a lot in easy-to-reach places. If you need to reach something above you, use a strong step stool that has a grab bar. Keep electrical cords out of the way. Do not use floor polish or wax that makes floors slippery. If you must use wax, use non-skid floor wax. Do not have throw rugs and other things on the floor that  can make you trip. What can I do with my stairs? Do not leave any items on the stairs. Make sure that there are handrails on both sides of the stairs and use them. Fix handrails that are broken or loose. Make sure that handrails are as long as the stairways. Check any carpeting to make sure that it is firmly attached to the stairs. Fix any carpet that is loose or worn. Avoid having throw rugs at the top or bottom of the stairs. If you do have throw rugs, attach them to the floor with carpet tape. Make sure that you have a light switch at the top of the stairs and the bottom of the stairs. If you do not have them, ask someone to add them for you. What else can I do to help prevent  falls? Wear shoes that: Do not have high heels. Have rubber bottoms. Are comfortable and fit you well. Are closed at the toe. Do not wear sandals. If you use a stepladder: Make sure that it is fully opened. Do not climb a closed stepladder. Make sure that both sides of the stepladder are locked into place. Ask someone to hold it for you, if possible. Clearly mark and make sure that you can see: Any grab bars or handrails. First and last steps. Where the edge of each step is. Use tools that help you move around (mobility aids) if they are needed. These include: Canes. Walkers. Scooters. Crutches. Turn on the lights when you go into a dark area. Replace any light bulbs as soon as they burn out. Set up your furniture so you have a clear path. Avoid moving your furniture around. If any of your floors are uneven, fix them. If there are any pets around you, be aware of where they are. Review your medicines with your doctor. Some medicines can make you feel dizzy. This can increase your chance of falling. Ask your doctor what other things that you can do to help prevent falls. This information is not intended to replace advice given to you by your health care provider. Make sure you discuss any questions you have with your health care provider. Document Released: 04/12/2009 Document Revised: 11/22/2015 Document Reviewed: 07/21/2014 Elsevier Interactive Patient Education  2017 [employer].

## 2021-08-21 ENCOUNTER — Ambulatory Visit
Admission: RE | Admit: 2021-08-21 | Discharge: 2021-08-21 | Disposition: A | Discharge status: Home / Self Care | Payer: Medicare Other | Source: Ambulatory Visit | Attending: Family | Admitting: Family

## 2021-08-21 ENCOUNTER — Other Ambulatory Visit: Payer: Self-pay

## 2021-08-21 DIAGNOSIS — Z1231 Encounter for screening mammogram for malignant neoplasm of breast: Secondary | ICD-10-CM | POA: Diagnosis not present

## 2021-12-24 ENCOUNTER — Other Ambulatory Visit: Payer: Self-pay | Admitting: Family

## 2021-12-24 DIAGNOSIS — E039 Hypothyroidism, unspecified: Secondary | ICD-10-CM

## 2022-01-06 ENCOUNTER — Encounter: Payer: Self-pay | Admitting: Family

## 2022-01-06 ENCOUNTER — Ambulatory Visit (INDEPENDENT_AMBULATORY_CARE_PROVIDER_SITE_OTHER): Payer: Medicare Other | Admitting: Family

## 2022-01-06 VITALS — BP 142/76 | HR 69 | Temp 98.4°F | Ht 60.0 in | Wt 107.0 lb

## 2022-01-06 DIAGNOSIS — M810 Age-related osteoporosis without current pathological fracture: Secondary | ICD-10-CM | POA: Diagnosis not present

## 2022-01-06 DIAGNOSIS — E785 Hyperlipidemia, unspecified: Secondary | ICD-10-CM | POA: Diagnosis not present

## 2022-01-06 DIAGNOSIS — E039 Hypothyroidism, unspecified: Secondary | ICD-10-CM | POA: Diagnosis not present

## 2022-01-06 DIAGNOSIS — Z720 Tobacco use: Secondary | ICD-10-CM | POA: Diagnosis not present

## 2022-01-06 DIAGNOSIS — Z0000 Encounter for general adult medical examination without abnormal findings: Secondary | ICD-10-CM

## 2022-01-06 DIAGNOSIS — Z0001 Encounter for general adult medical examination with abnormal findings: Secondary | ICD-10-CM | POA: Diagnosis not present

## 2022-01-06 DIAGNOSIS — I700 Atherosclerosis of aorta: Secondary | ICD-10-CM

## 2022-01-06 MED ORDER — ATORVASTATIN CALCIUM 20 MG PO TABS: 20.0000 mg | ORAL_TABLET | Freq: Every day | ORAL | 2 refills | Status: DC

## 2022-01-06 MED ORDER — LEVOTHYROXINE SODIUM 50 MCG PO TABS: ORAL_TABLET | ORAL | 1 refills | Status: DC

## 2022-01-06 MED ORDER — ALENDRONATE SODIUM 70 MG PO TABS: ORAL_TABLET | ORAL | 4 refills | Status: DC

## 2022-01-06 NOTE — Progress Notes (Signed)
Subjective:    Patient ID: Rhonda Kelly, female    DOB: 17-Jul-1945, 76 y.o.   MRN: [id number]  Chief Complaint  Patient presents with   Medical Management of Chronic Issues   PT presents to the office today for CPE and chronic follow up. She has aortic atherosclerosis and takes Lipitor daily. She has osteoporosis and takes fosamax weekly. Her last Dexascan was 08/28/20. She is very active and cuts wood and has a wood burning fire place.  Thyroid Problem Presents for follow-up visit. Symptoms include dry skin. Patient reports no cold intolerance, depressed mood, fatigue, hair loss or hoarse voice. The symptoms have been stable. Her past medical history is significant for hyperlipidemia.  Hyperlipidemia This is a chronic problem. The current episode started more than 1 year ago. The problem is controlled. Current antihyperlipidemic treatment includes statins. The current treatment provides moderate improvement of lipids. Risk factors for coronary artery disease include dyslipidemia and a sedentary lifestyle.  Nicotine Dependence Presents for follow-up visit. Symptoms are negative for fatigue. Her urge triggers include company of smokers. The symptoms have been stable. She smokes 1 pack of cigarettes per day.      Review of Systems  Constitutional:  Negative for fatigue.  HENT:  Negative for hoarse voice.   Endocrine: Negative for cold intolerance.  All other systems reviewed and are negative.  Family History  Problem Relation Age of Onset   Colon cancer Mother 13   Heart attack Mother        during gall bladder surgery   Heart disease Mother    Cancer Father        prostate   Diabetes Sister    Colon polyps Sister    Liver disease Daughter        States recent labs were normal   Colon cancer Maternal Aunt    Colon cancer Maternal Uncle    Colon cancer Maternal Grandmother 52   Stroke Brother    Heart attack Brother    CAD Brother    Heart attack Brother    CAD Brother     Diabetes Brother    Stomach cancer Neg Hx    Esophageal cancer Neg Hx    Pancreatic cancer Neg Hx    Breast cancer Neg Hx    Social History   Socioeconomic History   Marital status: Married    Spouse name: Gwyndolyn Saxon   Number of children: 1   Years of education: 14   Highest education level: Associate degree: academic program  Occupational History   Occupation: Retired    Fish farm manager: [employer]    Comment: Retired in 2009  Tobacco Use   Smoking status: Every Day    Packs/day: 0.50    Years: 45.00    Total pack years: 22.50    Types: Cigarettes   Smokeless tobacco: Never  Vaping Use   Vaping Use: Never used  Substance and Sexual Activity   Alcohol use: Not Currently    Alcohol/week: 0.0 standard drinks of alcohol   Drug use: No   Sexual activity: Yes    Birth control/protection: Surgical    Comment: 1st intercourse 76 yo-Fewer than 5 partners  Other Topics Concern   Not on file  Social History Narrative   Lives with her husband in a one level home [place] farm - they stay busy doing farm work, Educational psychologist and carrying wood, Social research officer, government   Social Determinants of Health   Financial Resource Strain: [place]  (07/15/2021)  Overall Financial Resource Strain (CARDIA)    Difficulty of Paying Living Expenses: Not hard at all  Food Insecurity: No Food Insecurity (07/15/2021)   Hunger Vital Sign    Worried About Running Out of Food in the Last Year: Never true    Ran Out of Food in the Last Year: Never true  Transportation Needs: No Transportation Needs (07/15/2021)   PRAPARE - Hydrologist (Medical): No    Lack of Transportation (Non-Medical): No  Physical Activity: Sufficiently Active (07/15/2021)   Exercise Vital Sign    Days of Exercise per Week: 6 days    Minutes of Exercise per Session: 30 min  Stress: No Stress Concern Present (07/15/2021)   [place]    Feeling of Stress : Only a little  Social  Connections: Socially Integrated (07/15/2021)   Social Connection and Isolation Panel [NHANES]    Frequency of Communication with Friends and Family: More than three times a week    Frequency of Social Gatherings with Friends and Family: More than three times a week    Attends Religious Services: More than 4 times per year    Active Member of [employer] or Organizations: Yes    Attends Music therapist: More than 4 times per year    Marital Status: Married       Objective:   Physical Exam Vitals reviewed.  Constitutional:      General: She is not in acute distress.    Appearance: She is well-developed.  HENT:     Head: Normocephalic and atraumatic.     Right Ear: Tympanic membrane normal.     Left Ear: Tympanic membrane normal.  Eyes:     Pupils: Pupils are equal, round, and reactive to light.  Neck:     Thyroid: No thyromegaly.  Cardiovascular:     Rate and Rhythm: Normal rate and regular rhythm.     Heart sounds: Normal heart sounds. No murmur heard. Pulmonary:     Effort: Pulmonary effort is normal. No respiratory distress.     Breath sounds: Normal breath sounds. No wheezing.  Abdominal:     General: Bowel sounds are normal. There is no distension.     Palpations: Abdomen is soft.     Tenderness: There is no abdominal tenderness.  Musculoskeletal:        General: No tenderness. Normal range of motion.     Cervical back: Normal range of motion and neck supple.  Skin:    General: Skin is warm and dry.  Neurological:     Mental Status: She is alert and oriented to person, place, and time.     Cranial Nerves: No cranial nerve deficit.     Deep Tendon Reflexes: Reflexes are normal and symmetric.  Psychiatric:        Behavior: Behavior normal.        Thought Content: Thought content normal.        Judgment: Judgment normal.      BP (!) 148/79   Pulse 67   Temp 98.4 F (36.9 C)   Ht 5' (1.524 m)   Wt 107 lb (48.5 kg)   SpO2 97%   BMI 20.90 kg/m       Assessment & Plan:  Rhonda Kelly comes in today with chief complaint of Medical Management of Chronic Issues   Diagnosis and orders addressed:  1. Hypothyroidism, unspecified type - CMP14+EGFR - CBC with Differential/Platelet -  TSH - levothyroxine (SYNTHROID) 50 MCG tablet; TAKE ONE TABLET EACH MORNING BEFORE BREAKFAST  Dispense: 90 tablet; Refill: 1  2. Aortic atherosclerosis (HCC) - CMP14+EGFR - CBC with Differential/Platelet - atorvastatin (LIPITOR) 20 MG tablet; Take 1 tablet (20 mg total) by mouth daily. (NEEDS TO BE SEEN BEFORE NEXT REFILL)  Dispense: 90 tablet; Refill: 2  3. Osteoporosis, postmenopausal - CMP14+EGFR - CBC with Differential/Platelet - alendronate (FOSAMAX) 70 MG tablet; TAKE 1 TABLET WEEKLY (TAKE WITH 8OZ OF WATER 30 MINUTES BEFORE BREAKFAST)  Dispense: 12 tablet; Refill: 4  4. Tobacco abuse - CMP14+EGFR - CBC with Differential/Platelet  5. Hyperlipidemia, unspecified hyperlipidemia type - CMP14+EGFR - CBC with Differential/Platelet - atorvastatin (LIPITOR) 20 MG tablet; Take 1 tablet (20 mg total) by mouth daily. (NEEDS TO BE SEEN BEFORE NEXT REFILL)  Dispense: 90 tablet; Refill: 2  6. Annual physical exam - CMP14+EGFR - CBC with Differential/Platelet - TSH   Labs pending Health Maintenance reviewed Diet and exercise encouraged  Follow up plan: 6 months    Evelina Dun, FNP

## 2022-01-06 NOTE — Patient Instructions (Signed)
Health Maintenance After Age 74 After age 74, you are at a higher risk for certain long-term diseases and infections as well as injuries from falls. Falls are a major cause of broken bones and head injuries in people who are older than age 74. Getting regular preventive care can help to keep you healthy and well. Preventive care includes getting regular testing and making lifestyle changes as recommended by your health care provider. Talk with your health care provider about: Which screenings and tests you should have. A screening is a test that checks for a disease when you have no symptoms. A diet and exercise plan that is right for you. What should I know about screenings and tests to prevent falls? Screening and testing are the best ways to find a health problem early. Early diagnosis and treatment give you the best chance of managing medical conditions that are common after age 74. Certain conditions and lifestyle choices may make you more likely to have a fall. Your health care provider may recommend: Regular vision checks. Poor vision and conditions such as cataracts can make you more likely to have a fall. If you wear glasses, make sure to get your prescription updated if your vision changes. Medicine review. Work with your health care provider to regularly review all of the medicines you are taking, including over-the-counter medicines. Ask your health care provider about any side effects that may make you more likely to have a fall. Tell your health care provider if any medicines that you take make you feel dizzy or sleepy. Strength and balance checks. Your health care provider may recommend certain tests to check your strength and balance while standing, walking, or changing positions. Foot health exam. Foot pain and numbness, as well as not wearing proper footwear, can make you more likely to have a fall. Screenings, including: Osteoporosis screening. Osteoporosis is a condition that causes  the bones to get weaker and break more easily. Blood pressure screening. Blood pressure changes and medicines to control blood pressure can make you feel dizzy. Depression screening. You may be more likely to have a fall if you have a fear of falling, feel depressed, or feel unable to do activities that you used to do. Alcohol use screening. Using too much alcohol can affect your balance and may make you more likely to have a fall. Follow these instructions at home: Lifestyle Do not drink alcohol if: Your health care provider tells you not to drink. If you drink alcohol: Limit how much you have to: 0-1 drink a day for women. 0-2 drinks a day for men. Know how much alcohol is in your drink. In the [place] one drink equals one 12 oz bottle of beer (355 mL), one 5 oz glass of wine (148 mL), or one 1 oz glass of hard liquor (44 mL). Do not use any products that contain nicotine or tobacco. These products include cigarettes, chewing tobacco, and vaping devices, such as e-cigarettes. If you need help quitting, ask your health care provider. Activity  Follow a regular exercise program to stay fit. This will help you maintain your balance. Ask your health care provider what types of exercise are appropriate for you. If you need a cane or walker, use it as recommended by your health care provider. Wear supportive shoes that have nonskid soles. Safety  Remove any tripping hazards, such as rugs, cords, and clutter. Install safety equipment such as grab bars in bathrooms and safety rails on stairs. Keep rooms and walkways   well-lit. General instructions Talk with your health care provider about your risks for falling. Tell your health care provider if: You fall. Be sure to tell your health care provider about all falls, even ones that seem minor. You feel dizzy, tiredness (fatigue), or off-balance. Take over-the-counter and prescription medicines only as told by your health care provider. These include  supplements. Eat a healthy diet and maintain a healthy weight. A healthy diet includes low-fat dairy products, low-fat (lean) meats, and fiber from whole grains, beans, and lots of fruits and vegetables. Stay current with your vaccines. Schedule regular health, dental, and eye exams. Summary Having a healthy lifestyle and getting preventive care can help to protect your health and wellness after age 74. Screening and testing are the best way to find a health problem early and help you avoid having a fall. Early diagnosis and treatment give you the best chance for managing medical conditions that are more common for people who are older than age 74. Falls are a major cause of broken bones and head injuries in people who are older than age 74. Take precautions to prevent a fall at home. Work with your health care provider to learn what changes you can make to improve your health and wellness and to prevent falls. This information is not intended to replace advice given to you by your health care provider. Make sure you discuss any questions you have with your health care provider. Document Revised: 11/05/2020 Document Reviewed: 11/05/2020 Elsevier Patient Education  2023 [employer]  

## 2022-01-07 LAB — CBC WITH DIFFERENTIAL/PLATELET
Basophils Absolute: 0.1 10*3/uL (ref 0.0–0.2)
Basos: 1 %
EOS (ABSOLUTE): 0.2 10*3/uL (ref 0.0–0.4)
Eos: 2 %
Hematocrit: 45.7 % (ref 34.0–46.6)
Hemoglobin: 15 g/dL (ref 11.1–15.9)
Immature Grans (Abs): 0 10*3/uL (ref 0.0–0.1)
Immature Granulocytes: 0 %
Lymphocytes Absolute: 2 10*3/uL (ref 0.7–3.1)
Lymphs: 27 %
MCH: 29.4 pg (ref 26.6–33.0)
MCHC: 32.8 g/dL (ref 31.5–35.7)
MCV: 90 fL (ref 79–97)
Monocytes Absolute: 0.6 10*3/uL (ref 0.1–0.9)
Monocytes: 8 %
Neutrophils Absolute: 4.6 10*3/uL (ref 1.4–7.0)
Neutrophils: 62 %
Platelets: 240 10*3/uL (ref 150–450)
RBC: 5.1 x10E6/uL (ref 3.77–5.28)
RDW: 12.7 % (ref 11.7–15.4)
WBC: 7.5 10*3/uL (ref 3.4–10.8)

## 2022-01-07 LAB — CMP14+EGFR
ALT: 16 IU/L (ref 0–32)
AST: 22 IU/L (ref 0–40)
Albumin/Globulin Ratio: 2.1 (ref 1.2–2.2)
Albumin: 4.6 g/dL (ref 3.8–4.8)
Alkaline Phosphatase: 83 IU/L (ref 44–121)
BUN/Creatinine Ratio: 11 — ABNORMAL LOW (ref 12–28)
BUN: 8 mg/dL (ref 8–27)
Bilirubin Total: 0.3 mg/dL (ref 0.0–1.2)
CO2: 22 mmol/L (ref 20–29)
Calcium: 9.8 mg/dL (ref 8.7–10.3)
Chloride: 103 mmol/L (ref 96–106)
Creatinine, Ser: 0.75 mg/dL (ref 0.57–1.00)
Globulin, Total: 2.2 g/dL (ref 1.5–4.5)
Glucose: 92 mg/dL (ref 70–99)
Potassium: 4.2 mmol/L (ref 3.5–5.2)
Sodium: 140 mmol/L (ref 134–144)
Total Protein: 6.8 g/dL (ref 6.0–8.5)
eGFR: 84 mL/min/{1.73_m2} (ref >59)

## 2022-01-07 LAB — TSH: TSH: 1.59 u[IU]/mL (ref 0.450–4.500)

## 2022-07-01 ENCOUNTER — Other Ambulatory Visit: Payer: Self-pay

## 2022-07-01 DIAGNOSIS — Z87891 Personal history of nicotine dependence: Secondary | ICD-10-CM

## 2022-07-01 DIAGNOSIS — Z122 Encounter for screening for malignant neoplasm of respiratory organs: Secondary | ICD-10-CM

## 2022-07-01 NOTE — Progress Notes (Signed)
Order placed for LDCT per protocol. LDCT scheduled for 08/11/22 at 2:45p.

## 2022-07-01 NOTE — Progress Notes (Signed)
Attempted to reach the patient for follow-up LDCT. Unable to reach the patient directly. Detailed VM left asking that the patient return my call.

## 2022-07-16 ENCOUNTER — Ambulatory Visit (INDEPENDENT_AMBULATORY_CARE_PROVIDER_SITE_OTHER): Payer: Medicare Other

## 2022-07-16 VITALS — Ht 61.0 in | Wt 107.0 lb

## 2022-07-16 DIAGNOSIS — Z780 Asymptomatic menopausal state: Secondary | ICD-10-CM

## 2022-07-16 DIAGNOSIS — Z1231 Encounter for screening mammogram for malignant neoplasm of breast: Secondary | ICD-10-CM

## 2022-07-16 DIAGNOSIS — Z0000 Encounter for general adult medical examination without abnormal findings: Secondary | ICD-10-CM

## 2022-07-16 NOTE — Progress Notes (Signed)
Subjective:   Rhonda Kelly is a 77 y.o. female who presents for Medicare Annual (Subsequent) preventive examination. I connected with  Yehudis Monceaux Kist on 07/16/22 by a audio enabled telemedicine application and verified that I am speaking with the correct person using two identifiers.  Patient Location: Home  Provider Location: Home Office  I discussed the limitations of evaluation and management by telemedicine. The patient expressed understanding and agreed to proceed.  Review of Systems     Cardiac Risk Factors include: advanced age (>19mn, >>82women)     Objective:    Today's Vitals   07/16/22 0855  Weight: 107 lb (48.5 kg)  Height: '5\' 1"'$  (1.549 m)   Body mass index is 20.22 kg/m.     07/16/2022    8:58 AM 07/15/2021    9:19 AM 07/12/2020    9:16 AM 07/12/2019    9:42 AM 07/09/2018   12:16 PM 07/08/2017   11:33 AM 07/08/2017   10:34 AM  Advanced Directives  Does Patient Have a Medical Advance Directive? Yes No Yes Yes Yes  Yes  Type of AParamedicof [place]alidaLiving will  [place]Living will [place] F[place]Living will [place]Living will  [place]Living will  Does patient want to make changes to medical advance directive?   No - Patient declined No - Patient declined No - Patient declined    Copy of [place] Chart? No - copy requested  No - copy requested No - copy requested No - copy requested No - copy requested No - copy requested  Would patient like information on creating a medical advance directive?  No - Patient declined         Current Medications (verified) Outpatient Encounter Medications as of 07/16/2022  Medication Sig   alendronate (FOSAMAX) 70 MG tablet TAKE 1 TABLET WEEKLY (TAKE WITH 8OZ OF WATER 30 MINUTES BEFORE BREAKFAST)   aspirin 81 MG tablet Take 81 mg by mouth daily.   atorvastatin (LIPITOR) 20 MG tablet Take 1 tablet (20 mg total) by  mouth daily. (NEEDS TO BE SEEN BEFORE NEXT REFILL)   BLACK ELDERBERRY PO Take by mouth.   Cholecalciferol 1000 units TBDP Take 1,000 Int'l Units by mouth daily.   Co-Enzyme Q-10 100 MG CAPS Take 100 mg by mouth daily.   KRILL OIL PO Take 1 capsule by mouth daily.   levothyroxine (SYNTHROID) 50 MCG tablet TAKE ONE TABLET EACH MORNING BEFORE BREAKFAST   multivitamin-lutein (OCUVITE-LUTEIN) CAPS capsule Take 1 capsule by mouth daily. icaps Areds 2   OVER THE COUNTER MEDICATION Vitamin c with zinc   RESTASIS 0.05 % ophthalmic emulsion    triamcinolone ointment (KENALOG) 0.5 % Apply 1 application topically 2 (two) times daily.   No facility-administered encounter medications on file as of 07/16/2022.    Allergies (verified) Patient has no known allergies.   History: Past Medical History:  Diagnosis Date   Allergy    some seasonal- otc meds prn only   Bladder prolapse, female, acquired    surgery pending to repair 09-24-15   Cancer ([facility]    skin cancer   Cataract    small   Colon polyp    Colon polyp    Diverticulosis    GERD (gastroesophageal reflux disease)    Hyperlipidemia    Macular degeneration    Osteoporosis    Thyroid disease    25 years ago- no meds now  Past Surgical History:  Procedure Laterality Date   ABDOMINAL HYSTERECTOMY Bilateral 1993   leiomyomata, bleeding   COLONOSCOPY     FOOT SURGERY  1977   right   MOUTH SURGERY     dental implants x 2   OOPHORECTOMY     BSO   POLYPECTOMY     SQUAMOUS CELL CARCINOMA EXCISION Left 2018   SCC removed from upper left leg   TONSILLECTOMY  1972   TUBAL LIGATION     Family History  Problem Relation Age of Onset   Colon cancer Mother 65   Heart attack Mother        during gall bladder surgery   Heart disease Mother    Cancer Father        prostate   Diabetes Sister    Colon polyps Sister    Liver disease Daughter        States recent labs were normal   Colon cancer Maternal Aunt    Colon cancer Maternal  Uncle    Colon cancer Maternal Grandmother 72   Stroke Brother    Heart attack Brother    CAD Brother    Heart attack Brother    CAD Brother    Diabetes Brother    Stomach cancer Neg Hx    Esophageal cancer Neg Hx    Pancreatic cancer Neg Hx    Breast cancer Neg Hx    Social History   Socioeconomic History   Marital status: Married    Spouse name: Gwyndolyn Saxon   Number of children: 1   Years of education: 14   Highest education level: Associate degree: academic program  Occupational History   Occupation: Retired    Fish farm manager: [employer]    Comment: Retired in 2009  Tobacco Use   Smoking status: Every Day    Packs/day: 0.50    Years: 45.00    Total pack years: 22.50    Types: Cigarettes   Smokeless tobacco: Never  Vaping Use   Vaping Use: Never used  Substance and Sexual Activity   Alcohol use: Not Currently    Alcohol/week: 0.0 standard drinks of alcohol   Drug use: No   Sexual activity: Yes    Birth control/protection: Surgical    Comment: 1st intercourse 77 yo-Fewer than 5 partners  Other Topics Concern   Not on file  Social History Narrative   Lives with her husband in a one level home [place] farm - they stay busy doing farm work, Educational psychologist and carrying wood, Social research officer, government   Social Determinants of Health   Financial Resource Strain: [place]  (07/16/2022)   Overall Financial Resource Strain (CARDIA)    Difficulty of Paying Living Expenses: Not hard at all  Food Insecurity: No Food Insecurity (07/16/2022)   Hunger Vital Sign    Worried About Running Out of Food in the Last Year: Never true    [place] in the Last Year: Never true  Transportation Needs: No Transportation Needs (07/16/2022)   PRAPARE - Hydrologist (Medical): No    Lack of Transportation (Non-Medical): No  Physical Activity: Insufficiently Active (07/16/2022)   Exercise Vital Sign    Days of Exercise per Week: 3 days    Minutes of Exercise per Session: 30 min  Stress: No Stress  Concern Present (07/16/2022)   [place]    Feeling of Stress : Not at all  Social Connections: Moderately Integrated (  07/16/2022)   Social Connection and Isolation Panel [NHANES]    Frequency of Communication with Friends and Family: More than three times a week    Frequency of Social Gatherings with Friends and Family: More than three times a week    Attends Religious Services: More than 4 times per year    Active Member of [employer] or Organizations: No    Attends Music therapist: Never    Marital Status: Married    Tobacco Counseling Ready to quit: Not Answered Counseling given: Not Answered   Clinical Intake:  Pre-visit preparation completed: Yes  Pain : No/denies pain     Nutritional Risks: None Diabetes: No  How often do you need to have someone help you when you read instructions, pamphlets, or other written materials from your doctor or pharmacy?: 1 - Never  Diabetic?no   Interpreter Needed?: No  Information entered by :: Jadene Pierini, LPN   Activities of Daily Living    07/16/2022    8:59 AM  In your present state of health, do you have any difficulty performing the following activities:  Hearing? 0  Vision? 0  Difficulty concentrating or making decisions? 0  Walking or climbing stairs? 0  Dressing or bathing? 0  Doing errands, shopping? 0  Preparing Food and eating ? N  Using the Toilet? N  In the past six months, have you accidently leaked urine? N  Do you have problems with loss of bowel control? N  Managing your Medications? N  Managing your Finances? N  Housekeeping or managing your Housekeeping? N    Patient Care Team: Sharion Balloon, FNP as PCP - General (Family Medicine) Harlen Labs, MD as Referring Physician (Optometry) Irene Shipper, MD as Consulting Physician (Gastroenterology)  Indicate any recent Medical Services you may have received from other than  [facility] providers in the past year (date may be approximate).     Assessment:   This is a routine wellness examination for Rhonda Kelly.  Hearing/Vision screen Vision Screening - Comments:: Wears rx glasses - up to date with routine eye exams with  Dr.Lee  Dietary issues and exercise activities discussed: Current Exercise Habits: Home exercise routine, Type of exercise: walking, Time (Minutes): 30, Frequency (Times/Week): 3, Weekly Exercise (Minutes/Week): 90, Intensity: Mild, Exercise limited by: None identified   Goals Addressed             This Visit's Progress    DIET - EAT MORE FRUITS    On track    Add at least 1 serving of fruit to diet per day.     DIET - INCREASE WATER INTAKE   On track    Try to drink 6-8 glasses of water daily       Depression Screen    07/16/2022    8:57 AM 01/06/2022   10:22 AM 07/15/2021    9:15 AM 07/10/2021    4:05 PM 08/28/2020   10:29 AM 07/12/2020    9:08 AM 03/01/2020    4:00 PM  PHQ 2/9 Scores  PHQ - 2 Score 0 0 1 0 0 0 0  PHQ- 9 Score  2 1 0       Fall Risk    07/16/2022    8:56 AM 01/06/2022   10:21 AM 07/15/2021    9:19 AM 08/28/2020   10:29 AM 07/12/2020    9:08 AM  Fall Risk   Falls in the past year? 0 0 0 0 0  Number falls  in past yr: 0  0    Injury with Fall? 0  0    Risk for fall due to : No Fall Risks  No Fall Risks    Follow up Falls prevention discussed  Falls prevention discussed      FALL RISK PREVENTION PERTAINING TO THE HOME:  Any stairs in or around the home? Yes  If so, are there any without handrails? No  Home free of loose throw rugs in walkways, pet beds, electrical cords, etc? Yes  Adequate lighting in your home to reduce risk of falls? Yes   ASSISTIVE DEVICES UTILIZED TO PREVENT FALLS:  Life alert? No  Use of a cane, walker or w/c? No  Grab bars in the bathroom? No  Shower chair or bench in shower? No  Elevated toilet seat or a handicapped toilet? No       07/08/2017   11:31 AM 02/05/2015   12:18 PM  MMSE - Mini  Mental State Exam  Orientation to time 5 5  Orientation to Place 5 5  Registration 3 3  Attention/ Calculation 5 5  Recall 3 3  Language- name 2 objects 2 2  Language- repeat 1 1  Language- follow 3 step command 3 3  Language- read & follow direction 1 1  Write a sentence 1 1  Copy design 1 1  Total score 30 30        07/16/2022    8:59 AM 07/12/2020    9:18 AM 07/12/2019    9:47 AM 06/12/2016    3:42 PM  6CIT Screen  What Year? 0 points 0 points 0 points 0 points  What month? 0 points 0 points 0 points 0 points  What time? 0 points 0 points 0 points 0 points  Count back from 20 0 points 0 points 0 points 0 points  Months in reverse 0 points 0 points 0 points 0 points  Repeat phrase 0 points 0 points 0 points 0 points  Total Score 0 points 0 points 0 points 0 points    Immunizations Immunization History  Administered Date(s) Administered   Influenza, High Dose Seasonal PF 06/12/2016, 04/20/2017, 05/10/2018   Influenza,inj,Quad PF,6+ Mos 05/03/2014, 04/10/2015, 05/01/2019   Influenza-Unspecified 04/29/2021   Moderna Sars-Covid-2 Vaccination 07/21/2019, 08/26/2019, 04/28/2020, 04/27/2021   Pneumococcal Conjugate-13 05/03/2014   Pneumococcal Polysaccharide-23 07/31/2015   Tdap 09/20/2019   Zoster Recombinat (Shingrix) 07/08/2017, 07/09/2018   Zoster, Live 02/05/2015    TDAP status: Up to date  Flu Vaccine status: Up to date  Pneumococcal vaccine status: Up to date  Covid-19 vaccine status: Completed vaccines  Qualifies for Shingles Vaccine? Yes   Zostavax completed Yes   Shingrix Completed?: Yes  Screening Tests Health Maintenance  Topic Date Due   Lung Cancer Screening  11/07/2021   INFLUENZA VACCINE  01/28/2022   COVID-19 Vaccine (5 - 2023-24 season) 02/28/2022   MAMMOGRAM  08/21/2022   DEXA SCAN  08/29/2022   Medicare Annual Wellness (AWV)  07/17/2023   COLONOSCOPY (Pts 45-32yr Insurance coverage will need to be confirmed)  10/18/2025   DTaP/Tdap/Td (2  - Td or Tdap) 09/19/2029   Pneumonia Vaccine 75 Years old  Completed   Hepatitis C Screening  Completed   Zoster Vaccines- Shingrix  Completed   HPV VACCINES  Aged Out    Health Maintenance  Health Maintenance Due  Topic Date Due   Lung Cancer Screening  11/07/2021   INFLUENZA VACCINE  01/28/2022   COVID-19 Vaccine (5 - 2023-24  season) 02/28/2022    Colorectal cancer screening: Type of screening: Colonoscopy. Completed 10/18/2020. Repeat every 5 years  Mammogram status: Ordered 07/16/2022. Pt provided with contact info and advised to call to schedule appt.   Bone Density status: Ordered 07/16/2022. Pt provided with contact info and advised to call to schedule appt.  Lung Cancer Screening: (Low Dose CT Chest recommended if Age 49-80 years, 30 pack-year currently smoking OR have quit w/in 15years.) does not qualify.   Lung Cancer Screening Referral: n/a  Additional Screening:  Hepatitis C Screening: does not qualify;   Vision Screening: Recommended annual ophthalmology exams for early detection of glaucoma and other disorders of the eye. Is the patient up to date with their annual eye exam?  Yes  Who is the provider or what is the name of the office in which the patient attends annual eye exams? Dr.Lee  If pt is not established with a provider, would they like to be referred to a provider to establish care? No .   Dental Screening: Recommended annual dental exams for proper oral hygiene  Community Resource Referral / Chronic Care Management: CRR required this visit?  No   CCM required this visit?  No      Plan:     I have personally reviewed and noted the following in the patient's chart:   Medical and social history Use of alcohol, tobacco or illicit drugs  Current medications and supplements including opioid prescriptions. Patient is not currently taking opioid prescriptions. Functional ability and status Nutritional status Physical activity Advanced  directives List of other physicians Hospitalizations, surgeries, and ER visits in previous 12 months Vitals Screenings to include cognitive, depression, and falls Referrals and appointments  In addition, I have reviewed and discussed with patient certain preventive protocols, quality metrics, and best practice recommendations. A written personalized care plan for preventive services as well as general preventive health recommendations were provided to patient.     Daphane Shepherd, LPN   [id number]   Nurse Notes: none

## 2022-07-16 NOTE — Patient Instructions (Signed)
Rhonda Kelly , Thank you for taking time to come for your Medicare Wellness Visit. I appreciate your ongoing commitment to your health goals. Please review the following plan we discussed and let me know if I can assist you in the future.   These are the goals we discussed:  Goals      AWV     07/12/2020 AWV Goal: Fall Prevention  Over the next year, patient will decrease their risk for falls by: Using assistive devices, such as a cane or walker, as needed Identifying fall risks within their home and correcting them by: Removing throw rugs Adding handrails to stairs or ramps Removing clutter and keeping a clear pathway throughout the home Increasing light, especially at night Adding shower handles/bars Raising toilet seat Identifying potential personal risk factors for falls: Medication side effects Incontinence/urgency Vestibular dysfunction Hearing loss Musculoskeletal disorders Neurological disorders Orthostatic hypotension       DIET - EAT MORE FRUITS      Add at least 1 serving of fruit to diet per day.     DIET - INCREASE WATER INTAKE     Try to drink 6-8 glasses of water daily     Quit Smoking     Use chantix and Nicorette to help quit smoking.          This is a list of the screening recommended for you and due dates:  Health Maintenance  Topic Date Due   Screening for Lung Cancer  11/07/2021   Flu Shot  01/28/2022   COVID-19 Vaccine (5 - 2023-24 season) 02/28/2022   Mammogram  08/21/2022   DEXA scan (bone density measurement)  08/29/2022   Medicare Annual Wellness Visit  07/17/2023   Colon Cancer Screening  10/18/2025   DTaP/Tdap/Td vaccine (2 - Td or Tdap) 09/19/2029   Pneumonia Vaccine  Completed   Hepatitis C Screening: USPSTF Recommendation to screen - Ages 78-79 yo.  Completed   Zoster (Shingles) Vaccine  Completed   HPV Vaccine  Aged Out    Advanced directives: Advance directive discussed with you today. I have provided a copy for you to complete  at home and have notarized. Once this is complete please bring a copy in to our office so we can scan it into your chart.   Conditions/risks identified: Aim for 30 minutes of exercise or brisk walking, 6-8 glasses of water, and 5 servings of fruits and vegetables each day.   Next appointment: Follow up in one year for your annual wellness visit    Preventive Care 65 Years and Older, Female Preventive care refers to lifestyle choices and visits with your health care provider that can promote health and wellness. What does preventive care include? A yearly physical exam. This is also called an annual well check. Dental exams once or twice a year. Routine eye exams. Ask your health care provider how often you should have your eyes checked. Personal lifestyle choices, including: Daily care of your teeth and gums. Regular physical activity. Eating a healthy diet. Avoiding tobacco and drug use. Limiting alcohol use. Practicing safe sex. Taking low-dose aspirin every day. Taking vitamin and mineral supplements as recommended by your health care provider. What happens during an annual well check? The services and screenings done by your health care provider during your annual well check will depend on your age, overall health, lifestyle risk factors, and family history of disease. Counseling  Your health care provider may ask you questions about your: Alcohol use. Tobacco use. Drug  use. Emotional well-being. Home and relationship well-being. Sexual activity. Eating habits. History of falls. Memory and ability to understand (cognition). Work and work Statistician. Reproductive health. Screening  You may have the following tests or measurements: Height, weight, and BMI. Blood pressure. Lipid and cholesterol levels. These may be checked every 5 years, or more frequently if you are over 90 years old. Skin check. Lung cancer screening. You may have this screening every year starting at  age 3 if you have a 30-pack-year history of smoking and currently smoke or have quit within the past 15 years. Fecal occult blood test (FOBT) of the stool. You may have this test every year starting at age 2. Flexible sigmoidoscopy or colonoscopy. You may have a sigmoidoscopy every 5 years or a colonoscopy every 10 years starting at age 68. Hepatitis C blood test. Hepatitis B blood test. Sexually transmitted disease (STD) testing. Diabetes screening. This is done by checking your blood sugar (glucose) after you have not eaten for a while (fasting). You may have this done every 1-3 years. Bone density scan. This is done to screen for osteoporosis. You may have this done starting at age 5. Mammogram. This may be done every 1-2 years. Talk to your health care provider about how often you should have regular mammograms. Talk with your health care provider about your test results, treatment options, and if necessary, the need for more tests. Vaccines  Your health care provider may recommend certain vaccines, such as: Influenza vaccine. This is recommended every year. Tetanus, diphtheria, and acellular pertussis (Tdap, Td) vaccine. You may need a Td booster every 10 years. Zoster vaccine. You may need this after age 93. Pneumococcal 13-valent conjugate (PCV13) vaccine. One dose is recommended after age 18. Pneumococcal polysaccharide (PPSV23) vaccine. One dose is recommended after age 46. Talk to your health care provider about which screenings and vaccines you need and how often you need them. This information is not intended to replace advice given to you by your health care provider. Make sure you discuss any questions you have with your health care provider. Document Released: 07/13/2015 Document Revised: 03/05/2016 Document Reviewed: 04/17/2015 Elsevier Interactive Patient Education  2017 [place] Prevention in the Home Falls can cause injuries. They can happen to people of all  ages. There are many things you can do to make your home safe and to help prevent falls. What can I do on the outside of my home? Regularly fix the edges of walkways and driveways and fix any cracks. Remove anything that might make you trip as you walk through a door, such as a raised step or threshold. Trim any bushes or trees on the path to your home. Use bright outdoor lighting. Clear any walking paths of anything that might make someone trip, such as rocks or tools. Regularly check to see if handrails are loose or broken. Make sure that both sides of any steps have handrails. Any raised decks and porches should have guardrails on the edges. Have any leaves, snow, or ice cleared regularly. Use sand or salt on walking paths during winter. Clean up any spills in your garage right away. This includes oil or grease spills. What can I do in the bathroom? Use night lights. Install grab bars by the toilet and in the tub and shower. Do not use towel bars as grab bars. Use non-skid mats or decals in the tub or shower. If you need to sit down in the shower, use a plastic, non-slip  stool. Keep the floor dry. Clean up any water that spills on the floor as soon as it happens. Remove soap buildup in the tub or shower regularly. Attach bath mats securely with double-sided non-slip rug tape. Do not have throw rugs and other things on the floor that can make you trip. What can I do in the bedroom? Use night lights. Make sure that you have a light by your bed that is easy to reach. Do not use any sheets or blankets that are too big for your bed. They should not hang down onto the floor. Have a firm chair that has side arms. You can use this for support while you get dressed. Do not have throw rugs and other things on the floor that can make you trip. What can I do in the kitchen? Clean up any spills right away. Avoid walking on wet floors. Keep items that you use a lot in easy-to-reach places. If you  need to reach something above you, use a strong step stool that has a grab bar. Keep electrical cords out of the way. Do not use floor polish or wax that makes floors slippery. If you must use wax, use non-skid floor wax. Do not have throw rugs and other things on the floor that can make you trip. What can I do with my stairs? Do not leave any items on the stairs. Make sure that there are handrails on both sides of the stairs and use them. Fix handrails that are broken or loose. Make sure that handrails are as long as the stairways. Check any carpeting to make sure that it is firmly attached to the stairs. Fix any carpet that is loose or worn. Avoid having throw rugs at the top or bottom of the stairs. If you do have throw rugs, attach them to the floor with carpet tape. Make sure that you have a light switch at the top of the stairs and the bottom of the stairs. If you do not have them, ask someone to add them for you. What else can I do to help prevent falls? Wear shoes that: Do not have high heels. Have rubber bottoms. Are comfortable and fit you well. Are closed at the toe. Do not wear sandals. If you use a stepladder: Make sure that it is fully opened. Do not climb a closed stepladder. Make sure that both sides of the stepladder are locked into place. Ask someone to hold it for you, if possible. Clearly mark and make sure that you can see: Any grab bars or handrails. First and last steps. Where the edge of each step is. Use tools that help you move around (mobility aids) if they are needed. These include: Canes. Walkers. Scooters. Crutches. Turn on the lights when you go into a dark area. Replace any light bulbs as soon as they burn out. Set up your furniture so you have a clear path. Avoid moving your furniture around. If any of your floors are uneven, fix them. If there are any pets around you, be aware of where they are. Review your medicines with your doctor. Some medicines  can make you feel dizzy. This can increase your chance of falling. Ask your doctor what other things that you can do to help prevent falls. This information is not intended to replace advice given to you by your health care provider. Make sure you discuss any questions you have with your health care provider. Document Released: 04/12/2009 Document Revised: 11/22/2015 Document Reviewed:  07/21/2014 Elsevier Interactive Patient Education  2017 [employer].

## 2022-08-11 ENCOUNTER — Ambulatory Visit (HOSPITAL_COMMUNITY)
Admission: RE | Admit: 2022-08-11 | Discharge: 2022-08-11 | Disposition: A | Discharge status: Home / Self Care | Payer: Medicare Other | Source: Ambulatory Visit | Attending: Physician Assistant | Admitting: Physician Assistant

## 2022-08-11 DIAGNOSIS — Z87891 Personal history of nicotine dependence: Secondary | ICD-10-CM | POA: Diagnosis not present

## 2022-08-11 DIAGNOSIS — Z122 Encounter for screening for malignant neoplasm of respiratory organs: Secondary | ICD-10-CM

## 2022-08-11 DIAGNOSIS — F17210 Nicotine dependence, cigarettes, uncomplicated: Secondary | ICD-10-CM | POA: Diagnosis not present

## 2022-08-13 NOTE — Progress Notes (Signed)
Patient notified of LDCT Lung Cancer Screening Results via mail with the recommendation to follow-up in 12 months. Patient's referring provider has been sent a copy of results. Results are as follows:   IMPRESSION: Lung-RADS 2, benign appearance or behavior. Continue annual screening with low-dose chest CT without contrast in 12 months.   Aortic Atherosclerosis (ICD10-I70.0) and Emphysema (ICD10-J43.9).

## 2022-08-14 ENCOUNTER — Other Ambulatory Visit: Payer: Self-pay | Admitting: Family

## 2022-08-14 DIAGNOSIS — J439 Emphysema, unspecified: Secondary | ICD-10-CM

## 2022-09-01 ENCOUNTER — Ambulatory Visit
Admission: RE | Admit: 2022-09-01 | Discharge: 2022-09-01 | Disposition: A | Discharge status: Home / Self Care | Payer: Medicare Other | Source: Ambulatory Visit | Attending: Family | Admitting: Family

## 2022-09-01 ENCOUNTER — Ambulatory Visit (INDEPENDENT_AMBULATORY_CARE_PROVIDER_SITE_OTHER): Payer: Medicare Other

## 2022-09-01 DIAGNOSIS — M8589 Other specified disorders of bone density and structure, multiple sites: Secondary | ICD-10-CM | POA: Diagnosis not present

## 2022-09-01 DIAGNOSIS — Z780 Asymptomatic menopausal state: Secondary | ICD-10-CM

## 2022-09-01 DIAGNOSIS — Z1231 Encounter for screening mammogram for malignant neoplasm of breast: Secondary | ICD-10-CM

## 2022-09-04 ENCOUNTER — Other Ambulatory Visit: Payer: Self-pay | Admitting: Family

## 2022-09-04 DIAGNOSIS — Z0000 Encounter for general adult medical examination without abnormal findings: Secondary | ICD-10-CM

## 2022-09-04 DIAGNOSIS — Z1231 Encounter for screening mammogram for malignant neoplasm of breast: Secondary | ICD-10-CM

## 2022-09-04 DIAGNOSIS — Z780 Asymptomatic menopausal state: Secondary | ICD-10-CM

## 2022-09-04 DIAGNOSIS — R928 Other abnormal and inconclusive findings on diagnostic imaging of breast: Secondary | ICD-10-CM

## 2022-09-05 ENCOUNTER — Ambulatory Visit (INDEPENDENT_AMBULATORY_CARE_PROVIDER_SITE_OTHER): Payer: Medicare Other | Admitting: Family

## 2022-09-05 ENCOUNTER — Encounter: Payer: Self-pay | Admitting: Family

## 2022-09-05 VITALS — BP 137/68 | HR 80 | Temp 97.7°F | Ht 61.0 in | Wt 109.0 lb

## 2022-09-05 DIAGNOSIS — J439 Emphysema, unspecified: Secondary | ICD-10-CM | POA: Diagnosis not present

## 2022-09-05 DIAGNOSIS — E039 Hypothyroidism, unspecified: Secondary | ICD-10-CM

## 2022-09-05 DIAGNOSIS — E785 Hyperlipidemia, unspecified: Secondary | ICD-10-CM | POA: Diagnosis not present

## 2022-09-05 DIAGNOSIS — I700 Atherosclerosis of aorta: Secondary | ICD-10-CM | POA: Diagnosis not present

## 2022-09-05 DIAGNOSIS — Z720 Tobacco use: Secondary | ICD-10-CM | POA: Diagnosis not present

## 2022-09-05 DIAGNOSIS — M810 Age-related osteoporosis without current pathological fracture: Secondary | ICD-10-CM | POA: Diagnosis not present

## 2022-09-05 DIAGNOSIS — R5383 Other fatigue: Secondary | ICD-10-CM | POA: Diagnosis not present

## 2022-09-05 MED ORDER — ATORVASTATIN CALCIUM 20 MG PO TABS: 20.0000 mg | ORAL_TABLET | Freq: Every day | ORAL | 2 refills | Status: DC

## 2022-09-05 MED ORDER — TRIAMCINOLONE ACETONIDE 0.5 % EX OINT: 1.0000 | TOPICAL_OINTMENT | Freq: Two times a day (BID) | CUTANEOUS | 0 refills | Status: DC

## 2022-09-05 MED ORDER — LEVOTHYROXINE SODIUM 50 MCG PO TABS: ORAL_TABLET | ORAL | 1 refills | Status: DC

## 2022-09-05 MED ORDER — ALENDRONATE SODIUM 70 MG PO TABS: ORAL_TABLET | ORAL | 4 refills | Status: DC

## 2022-09-05 NOTE — Patient Instructions (Addendum)
Osteoporosis  Osteoporosis happens when the bones become thin and less dense than normal. Osteoporosis makes bones more brittle and fragile and more likely to break (fracture). Over time, osteoporosis can cause your bones to become so weak that they fracture after a minor fall. Bones in the hip, wrist, and spine are most likely to fracture due to osteoporosis. What are the causes? The exact cause of this condition is not known. What increases the risk? You are more likely to develop this condition if you: Have family members with this condition. Have poor nutrition. Use the following: Steroid medicines, such as prednisone. Anti-seizure medicines. Nicotine or tobacco, such as cigarettes, e-cigarettes, and chewing tobacco. Are female. Are age 52 or older. Are not physically active (are sedentary). Are of European or Asian descent. Have a small body frame. What are the signs or symptoms? A fracture might be the first sign of osteoporosis, especially if the fracture results from a fall or injury that usually would not cause a bone to break. Other signs and symptoms include: Pain in the neck or low back. Stooped posture. Loss of height. How is this diagnosed? This condition may be diagnosed based on: Your medical history. A physical exam. A bone mineral density test, also called a DXA or DEXA test (dual-energy X-ray absorptiometry test). This test uses X-rays to measure the amount of minerals in your bones. How is this treated? This condition may be treated by: Making lifestyle changes, such as: Including foods with more calcium and vitamin D in your diet. Doing weight-bearing and muscle-strengthening exercises. Stopping tobacco use. Limiting alcohol intake. Taking medicine to slow the process of bone loss or to increase bone density. Taking daily supplements of calcium and vitamin D. Taking hormone replacement medicines, such as estrogen for women and testosterone for  men. Monitoring your levels of calcium and vitamin D. The goal of treatment is to strengthen your bones and lower your risk for a fracture. Follow these instructions at home: Eating and drinking Include calcium and vitamin D in your diet. Calcium is important for bone health, and vitamin D helps your body absorb calcium. Good sources of calcium and vitamin D include: Certain fatty fish, such as salmon and tuna. Products that have calcium and vitamin D added to them (are fortified), such as fortified cereals. Egg yolks. Cheese. Liver.  Activity Do exercises as told by your health care provider. Ask your health care provider what exercises and activities are safe for you. You should do: Exercises that make you work against gravity (weight-bearing exercises), such as tai chi, yoga, or walking. Exercises to strengthen muscles, such as lifting weights. Lifestyle Do not drink alcohol if: Your health care provider tells you not to drink. You are pregnant, may be pregnant, or are planning to become pregnant. If you drink alcohol: Limit how much you use to: 0-1 drink a day for women. 0-2 drinks a day for men. Know how much alcohol is in your drink. In the [place] one drink equals one 12 oz bottle of beer (355 mL), one 5 oz glass of wine (148 mL), or one 1 oz glass of hard liquor (44 mL). Do not use any products that contain nicotine or tobacco, such as cigarettes, e-cigarettes, and chewing tobacco. If you need help quitting, ask your health care provider. Preventing falls Use devices to help you move around (mobility aids) as needed, such as canes, walkers, scooters, or crutches. Keep rooms well-lit and clutter-free. Remove tripping hazards from walkways, including cords and  throw rugs. Install grab bars in bathrooms and safety rails on stairs. Use rubber mats in the bathroom and other areas that are often wet or slippery. Wear closed-toe shoes that fit well and support your feet. Wear shoes  that have rubber soles or low heels. Review your medicines with your health care provider. Some medicines can cause dizziness or changes in blood pressure, which can increase your risk of falling. General instructions Take over-the-counter and prescription medicines only as told by your health care provider. Keep all follow-up visits. This is important. Contact a health care provider if: You have never been screened for osteoporosis and you are: A woman who is age 25 or older. A man who is age 57 or older. Get help right away if: You fall or injure yourself. Summary Osteoporosis is thinning and loss of density in your bones. This makes bones more brittle and fragile and more likely to break (fracture),even with minor falls. The goal of treatment is to strengthen your bones and lower your risk for a fracture. Include calcium and vitamin D in your diet. Calcium is important for bone health, and vitamin D helps your body absorb calcium. Talk with your health care provider about screening for osteoporosis if you are a woman who is age 18 or older, or a man who is age 43 or older. This information is not intended to replace advice given to you by your health care provider. Make sure you discuss any questions you have with your health care provider.  Restless Legs Syndrome Restless legs syndrome is a condition that causes uncomfortable feelings or sensations in the legs, especially while sitting or lying down. The sensations usually cause an overwhelming urge to move the legs. The arms can also sometimes be affected. The condition can range from mild to severe. The symptoms often interfere with a person's ability to sleep. What are the causes? The cause of this condition is not known. What increases the risk? The following factors may make you more likely to develop this condition: Being older than 50. Pregnancy. Being a woman. In general, the condition is more common in women than in men. A  family history of the condition. Having iron deficiency. Overuse of caffeine, nicotine, or alcohol. Certain medical conditions, such as kidney disease, Parkinson's disease, or nerve damage. Certain medicines, such as those for high blood pressure, nausea, colds, allergies, depression, and some heart conditions. What are the signs or symptoms? The main symptom of this condition is uncomfortable sensations in the legs, such as: Pulling. Tingling. Prickling. Throbbing. Crawling. Burning. Usually, the sensations: Affect both sides of the body. Are worse when you sit or lie down. Are worse at night. These may make it difficult to fall asleep. Make you have a strong urge to move your legs. Are temporarily relieved by moving your legs or standing. The arms can also be affected, but this is rare. People who have this condition often have tiredness during the day because of their lack of sleep at night. How is this diagnosed? This condition may be diagnosed based on: Your symptoms. Blood tests. In some cases, you may be monitored in a sleep lab by a specialist (a sleep study). This can detect any disruptions in your sleep. How is this treated? This condition is treated by managing the symptoms. This may include: Lifestyle changes, such as exercising, using relaxation techniques, and avoiding caffeine, alcohol, or tobacco. Iron supplements. Medicines. Parkinson's medications may be tried first. Anti-seizure medications can also be  helpful. Follow these instructions at home: General instructions Take over-the-counter and prescription medicines only as told by your health care provider. Use methods to help relieve the uncomfortable sensations, such as: Massaging your legs. Walking or stretching. Taking a cold or hot bath. Keep all follow-up visits. This is important. Lifestyle     Practice good sleep habits. For example, go to bed and get up at the same time every day. Most adults  should get 7-9 hours of sleep each night. Exercise regularly. Try to get at least 30 minutes of exercise most days of the week. Practice ways of relaxing, such as yoga or meditation. Avoid caffeine and alcohol. Do not use any products that contain nicotine or tobacco. These products include cigarettes, chewing tobacco, and vaping devices, such as e-cigarettes. If you need help quitting, ask your health care provider. Where to find more information [employer] of Neurological Disorders and Stroke: [employer] Contact a health care provider if: Your symptoms get worse or they do not improve with treatment. Summary Restless legs syndrome is a condition that causes uncomfortable feelings or sensations in the legs, especially while sitting or lying down. The symptoms often interfere with your ability to sleep. This condition is treated by managing the symptoms. You may need to make lifestyle changes or take medicines. This information is not intended to replace advice given to you by your health care provider. Make sure you discuss any questions you have with your health care provider. Document Revised: 01/27/2021 Document Reviewed: 01/27/2021 Elsevier Patient Education  [place] Revised: 12/01/2019 Document Reviewed: 12/01/2019 Elsevier Patient Education  [place].

## 2022-09-05 NOTE — Progress Notes (Signed)
Subjective:    Patient ID: Rhonda Kelly, female    DOB: 12-May-1946, 77 y.o.   MRN: [id number]  Chief Complaint  Patient presents with   Medical Management of Chronic Issues   PT presents to the office today for chronic follow up.   She has aortic atherosclerosis and takes Lipitor daily. She has osteoporosis and takes fosamax weekly. Her last Dexascan was 08/28/20.   Complaining of fatigue.  Thyroid Problem Presents for follow-up visit. Symptoms include dry skin and fatigue. Patient reports no anxiety, constipation, diaphoresis, diarrhea or hoarse voice. The symptoms have been stable. Her past medical history is significant for hyperlipidemia.  Hyperlipidemia This is a chronic problem. The current episode started more than 1 year ago. The problem is uncontrolled. Recent lipid tests were reviewed and are high. Factors aggravating her hyperlipidemia include smoking. Current antihyperlipidemic treatment includes statins. The current treatment provides moderate improvement of lipids. Risk factors for coronary artery disease include dyslipidemia, hypertension, a sedentary lifestyle and post-menopausal.  Nicotine Dependence Presents for follow-up visit. Symptoms include fatigue. Her urge triggers include company of smokers. The symptoms have been stable. She smokes < 1/2 a pack of cigarettes per day.      Review of Systems  Constitutional:  Positive for fatigue. Negative for diaphoresis.  HENT:  Negative for hoarse voice.   Gastrointestinal:  Negative for constipation and diarrhea.  Psychiatric/Behavioral:  The patient is not nervous/anxious.   All other systems reviewed and are negative.      Objective:   Physical Exam Vitals reviewed.  Constitutional:      General: She is not in acute distress.    Appearance: She is well-developed. She is obese.  HENT:     Head: Normocephalic and atraumatic.     Right Ear: Tympanic membrane normal.     Left Ear: Tympanic membrane normal.  Eyes:      Pupils: Pupils are equal, round, and reactive to light.  Neck:     Thyroid: No thyromegaly.  Cardiovascular:     Rate and Rhythm: Normal rate and regular rhythm.     Heart sounds: Normal heart sounds. No murmur heard. Pulmonary:     Effort: Pulmonary effort is normal. No respiratory distress.     Breath sounds: Normal breath sounds. No wheezing.  Abdominal:     General: Bowel sounds are normal. There is no distension.     Palpations: Abdomen is soft.     Tenderness: There is no abdominal tenderness.  Musculoskeletal:        General: No tenderness. Normal range of motion.     Cervical back: Normal range of motion and neck supple.  Skin:    General: Skin is warm and dry.  Neurological:     Mental Status: She is alert and oriented to person, place, and time.     Cranial Nerves: No cranial nerve deficit.     Deep Tendon Reflexes: Reflexes are normal and symmetric.  Psychiatric:        Behavior: Behavior normal.        Thought Content: Thought content normal.        Judgment: Judgment normal.       BP 137/68   Pulse 80   Temp 97.7 F (36.5 C) (Temporal)   Ht '5\' 1"'$  (1.549 m)   Wt 109 lb (49.4 kg)   SpO2 98%   BMI 20.60 kg/m      Assessment & Plan:  Rhonda Kelly comes in today with chief  complaint of Medical Management of Chronic Issues   Diagnosis and orders addressed:  1. Osteoporosis, postmenopausal - alendronate (FOSAMAX) 70 MG tablet; TAKE 1 TABLET WEEKLY (TAKE WITH 8OZ OF WATER 30 MINUTES BEFORE BREAKFAST)  Dispense: 12 tablet; Refill: 4 - CMP14+EGFR - CBC with Differential/Platelet - VITAMIN D 25 Hydroxy (Vit-D Deficiency, Fractures)  2. Aortic atherosclerosis (HCC) - atorvastatin (LIPITOR) 20 MG tablet; Take 1 tablet (20 mg total) by mouth daily. (NEEDS TO BE SEEN BEFORE NEXT REFILL)  Dispense: 90 tablet; Refill: 2 - CMP14+EGFR - CBC with Differential/Platelet  3. Hyperlipidemia, unspecified hyperlipidemia type - atorvastatin (LIPITOR) 20 MG tablet;  Take 1 tablet (20 mg total) by mouth daily. (NEEDS TO BE SEEN BEFORE NEXT REFILL)  Dispense: 90 tablet; Refill: 2 - CMP14+EGFR - CBC with Differential/Platelet - Lipid panel  4. Hypothyroidism, unspecified type - levothyroxine (SYNTHROID) 50 MCG tablet; TAKE ONE TABLET EACH MORNING BEFORE BREAKFAST  Dispense: 90 tablet; Refill: 1 - CMP14+EGFR - CBC with Differential/Platelet - TSH  5. Tobacco abuse - CMP14+EGFR - CBC with Differential/Platelet  6. Pulmonary emphysema, unspecified emphysema type ([place]) - CMP14+EGFR - CBC with Differential/Platelet   Labs pending Patient reviewed in [place] controlled database, no flags noted. Contract and drug screen are up to date.  Health Maintenance reviewed Diet and exercise encouraged  Follow up plan: 6 months    Evelina Dun, FNP

## 2022-09-08 LAB — CBC WITH DIFFERENTIAL/PLATELET
Basophils Absolute: 0.1 10*3/uL (ref 0.0–0.2)
Basos: 1 %
EOS (ABSOLUTE): 0.1 10*3/uL (ref 0.0–0.4)
Eos: 2 %
Hematocrit: 43.9 % (ref 34.0–46.6)
Hemoglobin: 14.1 g/dL (ref 11.1–15.9)
Immature Grans (Abs): 0 10*3/uL (ref 0.0–0.1)
Immature Granulocytes: 0 %
Lymphocytes Absolute: 2.2 10*3/uL (ref 0.7–3.1)
Lymphs: 35 %
MCH: 29.1 pg (ref 26.6–33.0)
MCHC: 32.1 g/dL (ref 31.5–35.7)
MCV: 91 fL (ref 79–97)
Monocytes Absolute: 0.5 10*3/uL (ref 0.1–0.9)
Monocytes: 8 %
Neutrophils Absolute: 3.3 10*3/uL (ref 1.4–7.0)
Neutrophils: 54 %
Platelets: 207 10*3/uL (ref 150–450)
RBC: 4.84 x10E6/uL (ref 3.77–5.28)
RDW: 13 % (ref 11.7–15.4)
WBC: 6.2 10*3/uL (ref 3.4–10.8)

## 2022-09-08 LAB — IRON,TIBC AND FERRITIN PANEL
Ferritin: 58 ng/mL (ref 15–150)
Iron Saturation: 23 % (ref 15–55)
Iron: 77 ug/dL (ref 27–139)
Total Iron Binding Capacity: 335 ug/dL (ref 250–450)
UIBC: 258 ug/dL (ref 118–369)

## 2022-09-08 LAB — CMP14+EGFR
ALT: 17 IU/L (ref 0–32)
AST: 26 IU/L (ref 0–40)
Albumin/Globulin Ratio: 2.4 — ABNORMAL HIGH (ref 1.2–2.2)
Albumin: 4.6 g/dL (ref 3.8–4.8)
Alkaline Phosphatase: 84 IU/L (ref 44–121)
BUN/Creatinine Ratio: 10 — ABNORMAL LOW (ref 12–28)
BUN: 7 mg/dL — ABNORMAL LOW (ref 8–27)
Bilirubin Total: 0.4 mg/dL (ref 0.0–1.2)
CO2: 21 mmol/L (ref 20–29)
Calcium: 9.4 mg/dL (ref 8.7–10.3)
Chloride: 102 mmol/L (ref 96–106)
Creatinine, Ser: 0.7 mg/dL (ref 0.57–1.00)
Globulin, Total: 1.9 g/dL (ref 1.5–4.5)
Glucose: 83 mg/dL (ref 70–99)
Potassium: 4.2 mmol/L (ref 3.5–5.2)
Sodium: 141 mmol/L (ref 134–144)
Total Protein: 6.5 g/dL (ref 6.0–8.5)
eGFR: 91 mL/min/{1.73_m2} (ref >59)

## 2022-09-08 LAB — LIPID PANEL
Chol/HDL Ratio: 3 ratio (ref 0.0–4.4)
Cholesterol, Total: 196 mg/dL (ref 100–199)
HDL: 66 mg/dL (ref >39)
LDL Chol Calc (NIH): 112 mg/dL — ABNORMAL HIGH (ref 0–99)
Triglycerides: 99 mg/dL (ref 0–149)
VLDL Cholesterol Cal: 18 mg/dL (ref 5–40)

## 2022-09-08 LAB — VITAMIN D 25 HYDROXY (VIT D DEFICIENCY, FRACTURES): Vit D, 25-Hydroxy: 166 ng/mL — ABNORMAL HIGH (ref 30.0–100.0)

## 2022-09-08 LAB — TSH: TSH: 1.81 u[IU]/mL (ref 0.450–4.500)

## 2022-09-18 ENCOUNTER — Ambulatory Visit
Admission: RE | Admit: 2022-09-18 | Discharge: 2022-09-18 | Disposition: A | Discharge status: Home / Self Care | Payer: Medicare Other | Source: Ambulatory Visit | Attending: Family | Admitting: Family

## 2022-09-18 ENCOUNTER — Ambulatory Visit: Payer: Medicare Other

## 2022-09-18 DIAGNOSIS — R928 Other abnormal and inconclusive findings on diagnostic imaging of breast: Secondary | ICD-10-CM | POA: Diagnosis not present

## 2022-10-09 ENCOUNTER — Ambulatory Visit (INDEPENDENT_AMBULATORY_CARE_PROVIDER_SITE_OTHER): Payer: Medicare Other | Admitting: Family

## 2022-10-09 ENCOUNTER — Encounter: Payer: Self-pay | Admitting: Family

## 2022-10-09 VITALS — BP 139/73 | HR 77 | Temp 97.6°F | Ht 61.0 in | Wt 108.8 lb

## 2022-10-09 DIAGNOSIS — Z85828 Personal history of other malignant neoplasm of skin: Secondary | ICD-10-CM

## 2022-10-09 DIAGNOSIS — L989 Disorder of the skin and subcutaneous tissue, unspecified: Secondary | ICD-10-CM | POA: Diagnosis not present

## 2022-10-09 NOTE — Progress Notes (Signed)
   Subjective:    Patient ID: Rhonda Kelly, female    DOB: [birth year], 77 y.o.   MRN: [id number]  Chief Complaint  Patient presents with   Sore    On left wrist     HPI She reports she has a lesion left lateral wrist for 5-6 weeks ago. She reports she hit her wrist on something, but will not heal. Denies any pain, but does itch. She reports she had a lesion similar on her thigh that was squamous cell.    Review of Systems     Objective:   Physical Exam Vitals reviewed.  Constitutional:      General: She is not in acute distress.    Appearance: She is well-developed.  HENT:     Head: Normocephalic and atraumatic.  Eyes:     Pupils: Pupils are equal, round, and reactive to light.  Neck:     Thyroid: No thyromegaly.  Cardiovascular:     Rate and Rhythm: Normal rate and regular rhythm.     Heart sounds: Normal heart sounds. No murmur heard. Pulmonary:     Effort: Pulmonary effort is normal. No respiratory distress.     Breath sounds: Normal breath sounds. No wheezing.  Abdominal:     General: Bowel sounds are normal. There is no distension.     Palpations: Abdomen is soft.     Tenderness: There is no abdominal tenderness.  Musculoskeletal:        General: No tenderness. Normal range of motion.     Cervical back: Normal range of motion and neck supple.  Skin:    General: Skin is warm and dry.     Comments: Circular erythemas lesion apprx 1.1X0.9 cm  Neurological:     Mental Status: She is alert and oriented to person, place, and time.     Cranial Nerves: No cranial nerve deficit.     Deep Tendon Reflexes: Reflexes are normal and symmetric.  Psychiatric:        Behavior: Behavior normal.        Thought Content: Thought content normal.        Judgment: Judgment normal.           Assessment & Plan:   Abrigail Corman Chisom comes in today with chief complaint of Sore (On left wrist/)   Diagnosis and orders addressed:  1. Skin lesion - Ambulatory referral to  Dermatology  2. Hx of squamous cell carcinoma of skin - Ambulatory referral to Dermatology    Avoid picking or squeezing  Referral to Dermatologists pending   Jannifer Rodney, FNP

## 2022-10-09 NOTE — Patient Instructions (Signed)
Squamous Cell Carcinoma Squamous cell carcinoma is a common form of skin cancer. It begins in the squamous cells in the outer layer of the skin (epidermis). It occurs most often in parts of the body that are frequently exposed to the sun, such as the face, ears, lips, neck, arms, legs, and hands. However, this condition can occur anywhere on the body, including the inside of the mouth, the genital area, and the opening between the buttocks (anus). If squamous cell carcinoma is treated early, it rarely spreads to other areas of the body (metastasizes). If it is not treated, it can affect nearby tissues. In rare cases, it can spread to other areas of the body. What are the causes? This condition is usually caused by exposure to ultraviolet (UV) light. UV light may come from the sun or from tanning beds.  Other causes include exposure to: Arsenic. Radiation. Toxic tars and oils. In very rare cases, a genetic condition that makes a person sensitive to sunlight (xeroderma pigmentosum) may cause the condition. What increases the risk? This condition is more likely to develop in people who: Are older than 75 years of age. Have light-colored skin, blond or red hair, or blue, green, or gray eyes. Have childhood freckling. Have had repeated sunburns or sun exposure over long periods of time, especially during childhood. Use tanning beds. Have had psoralen and ultraviolet A (PUVA) therapy. Have a weakened body defense (immune system). Have had an HPV (human papillomavirus) infection. Have a history of precancerous lesions (actinic keratosis). Have conditions that cause chronic scarring. These can include burn scars, chronic ulcers, heat injuries, and radiation. Use any products that contain nicotine or tobacco. What are the signs or symptoms?  This condition often starts as a red, pink, or brown growth on the skin. The growths have an irregular surface that may feel rough. In some cases, the growths  are easier to feel than to see. The growths may develop into a sore that does not heal. How is this diagnosed? This condition may be diagnosed with: A physical exam. Removal of a tissue sample to be examined under a microscope (biopsy). How is this treated? Treatment for this condition involves removing the cancerous tissue. The method that is used for this depends on the size and location of the tumor, as well as your overall health. Possible treatments include: Electrodesiccation and curettage. This involves alternately scraping and burning the tumor while using an electric current to control bleeding. Cryosurgery. This involves freezing the tumor with liquid nitrogen. Laser therapy. This uses an intense beam of light to remove the tumor. Photodynamic therapy. A chemical cream is applied to the skin, and light exposure is used to activate the chemical. High-energy rays that kill cancer cells (radiation therapy). This may be used for tumors that are deeper in the tissues. Surgical removal (excision) of the tumor. This involves removing the entire tumor and a small amount of normal skin that surrounds it. Mohs surgery. In this procedure, the cancerous skin cells are removed layer by layer until all of the tumor has been removed. Plastic surgery. The tumor is removed, and healthy skin from another part of the body is used to cover the wound (skin graft). Chemotherapy. This treatment uses medicines that kill cancer cells. Chemotherapy creams or lotions. These may be applied directly to the skin where the tumor is located and may be used for smaller tumors. Targeted therapy. This targets specific parts of cancer cells and the area around them to block   the growth and spread of the cancer. Immunotherapy. This treatment helps your body's immune system fight the cancer cells. Follow these instructions at home:  Avoid being out in the sun. Wear protective clothing, including long-sleeved shirts, long  pants, UV-blocking sunglasses, and a hat when outdoors. Do skin self-exams as told by your health care provider. Look for any new spots or changes in your skin. Do not use any products that contain nicotine or tobacco. These products include cigarettes, chewing tobacco, and vaping devices, such as e-cigarettes. If you need help quitting, ask your health care provider. Keep all follow-up visits. This is important. How is this prevented?  Avoid the sun when it is at its strongest. This is usually between 10:00 a.m. and 4:00 p.m. When you are out in the sun, use sunscreen that has a sun protection factor (SPF) of at least 30. Apply sunscreen at least 30 minutes before exposure to the sun. Reapply sunscreen every 2 hours while you are outside. Also reapply it after swimming and after sweating a lot. Always wear hats, protective clothing, and UV-blocking sunglasses when you are outdoors. Do not use tanning beds. Contact a health care provider if: You notice any new growths or any changes in your skin. You have had a squamous cell carcinoma tumor removed and you notice a new growth in the same location. Summary Squamous cell carcinoma is a common form of skin cancer. It begins in the squamous cells in the outer layer of the skin (epidermis). This condition is usually caused by exposure to ultraviolet (UV) light. Treatment for this condition involves removing the cancerous tissue. The method that is used for this depends on the size and location of the tumor, as well as your overall health. Contact a health care provider if you notice any new growths or any changes in your skin. Keep all follow-up visits. This is important. This information is not intended to replace advice given to you by your health care provider. Make sure you discuss any questions you have with your health care provider. Document Revised: 12/11/2020 Document Reviewed: 12/11/2020 Elsevier Patient Education  2023 [employer] Inc[employer]  

## 2022-10-17 DIAGNOSIS — C44629 Squamous cell carcinoma of skin of left upper limb, including shoulder: Secondary | ICD-10-CM | POA: Diagnosis not present

## 2022-10-17 DIAGNOSIS — L570 Actinic keratosis: Secondary | ICD-10-CM | POA: Diagnosis not present

## 2022-10-17 DIAGNOSIS — Z85828 Personal history of other malignant neoplasm of skin: Secondary | ICD-10-CM | POA: Diagnosis not present

## 2023-03-05 ENCOUNTER — Other Ambulatory Visit: Payer: Self-pay | Admitting: Family

## 2023-03-05 DIAGNOSIS — E039 Hypothyroidism, unspecified: Secondary | ICD-10-CM

## 2023-03-05 DIAGNOSIS — I700 Atherosclerosis of aorta: Secondary | ICD-10-CM

## 2023-03-05 DIAGNOSIS — E785 Hyperlipidemia, unspecified: Secondary | ICD-10-CM

## 2023-04-10 DIAGNOSIS — Z23 Encounter for immunization: Secondary | ICD-10-CM | POA: Diagnosis not present

## 2023-04-23 DIAGNOSIS — L738 Other specified follicular disorders: Secondary | ICD-10-CM | POA: Diagnosis not present

## 2023-04-23 DIAGNOSIS — D225 Melanocytic nevi of trunk: Secondary | ICD-10-CM | POA: Diagnosis not present

## 2023-04-23 DIAGNOSIS — L820 Inflamed seborrheic keratosis: Secondary | ICD-10-CM | POA: Diagnosis not present

## 2023-04-23 DIAGNOSIS — D1801 Hemangioma of skin and subcutaneous tissue: Secondary | ICD-10-CM | POA: Diagnosis not present

## 2023-04-23 DIAGNOSIS — L720 Epidermal cyst: Secondary | ICD-10-CM | POA: Diagnosis not present

## 2023-04-23 DIAGNOSIS — Z85828 Personal history of other malignant neoplasm of skin: Secondary | ICD-10-CM | POA: Diagnosis not present

## 2023-04-23 DIAGNOSIS — R208 Other disturbances of skin sensation: Secondary | ICD-10-CM | POA: Diagnosis not present

## 2023-04-23 DIAGNOSIS — L570 Actinic keratosis: Secondary | ICD-10-CM | POA: Diagnosis not present

## 2023-04-23 DIAGNOSIS — L821 Other seborrheic keratosis: Secondary | ICD-10-CM | POA: Diagnosis not present

## 2023-04-23 DIAGNOSIS — D692 Other nonthrombocytopenic purpura: Secondary | ICD-10-CM | POA: Diagnosis not present

## 2023-06-03 ENCOUNTER — Other Ambulatory Visit: Payer: Self-pay | Admitting: Family

## 2023-06-03 DIAGNOSIS — I700 Atherosclerosis of aorta: Secondary | ICD-10-CM

## 2023-06-03 DIAGNOSIS — E039 Hypothyroidism, unspecified: Secondary | ICD-10-CM

## 2023-06-03 DIAGNOSIS — E785 Hyperlipidemia, unspecified: Secondary | ICD-10-CM

## 2023-06-03 NOTE — Telephone Encounter (Signed)
[name] pt NTBS 30-d given 03/05/23

## 2023-06-03 NOTE — Telephone Encounter (Signed)
Pt scheduled appt for 07/03/2023

## 2023-07-02 ENCOUNTER — Other Ambulatory Visit: Payer: Self-pay

## 2023-07-02 DIAGNOSIS — Z122 Encounter for screening for malignant neoplasm of respiratory organs: Secondary | ICD-10-CM

## 2023-07-02 DIAGNOSIS — Z87891 Personal history of nicotine dependence: Secondary | ICD-10-CM

## 2023-07-03 ENCOUNTER — Other Ambulatory Visit: Payer: Medicare Other

## 2023-07-03 ENCOUNTER — Telehealth: Payer: Self-pay

## 2023-07-03 ENCOUNTER — Encounter: Payer: Self-pay | Admitting: Family

## 2023-07-03 ENCOUNTER — Telehealth (INDEPENDENT_AMBULATORY_CARE_PROVIDER_SITE_OTHER): Payer: Medicare Other | Admitting: Family

## 2023-07-03 ENCOUNTER — Other Ambulatory Visit: Payer: Self-pay | Admitting: Family Medicine

## 2023-07-03 DIAGNOSIS — Z720 Tobacco use: Secondary | ICD-10-CM

## 2023-07-03 DIAGNOSIS — I700 Atherosclerosis of aorta: Secondary | ICD-10-CM | POA: Diagnosis not present

## 2023-07-03 DIAGNOSIS — M810 Age-related osteoporosis without current pathological fracture: Secondary | ICD-10-CM

## 2023-07-03 DIAGNOSIS — E785 Hyperlipidemia, unspecified: Secondary | ICD-10-CM

## 2023-07-03 DIAGNOSIS — J439 Emphysema, unspecified: Secondary | ICD-10-CM | POA: Diagnosis not present

## 2023-07-03 DIAGNOSIS — E039 Hypothyroidism, unspecified: Secondary | ICD-10-CM

## 2023-07-03 MED ORDER — BUDESONIDE-FORMOTEROL FUMARATE 160-4.5 MCG/ACT IN AERO: 2.0000 | INHALATION_SPRAY | Freq: Two times a day (BID) | RESPIRATORY_TRACT | 3 refills | Status: DC

## 2023-07-03 MED ORDER — ATORVASTATIN CALCIUM 20 MG PO TABS: 20.0000 mg | ORAL_TABLET | Freq: Every day | ORAL | 3 refills | Status: DC

## 2023-07-03 MED ORDER — ALENDRONATE SODIUM 70 MG PO TABS: ORAL_TABLET | ORAL | 4 refills | Status: DC

## 2023-07-03 MED ORDER — LEVOTHYROXINE SODIUM 50 MCG PO TABS
ORAL_TABLET | ORAL | 1 refills | Status: DC
Start: 2023-07-03 — End: 2023-11-30

## 2023-07-03 NOTE — Telephone Encounter (Signed)
 Copied from CRM [id number]. Topic: Clinical - Prescription Issue >> Jul 03, 2023 10:04 AM Curlee DEL wrote: Reason for CRM: Pharmacy needs clarification on the prescription for budesonide -formoterol  (SYMBICORT ) 160-4.5 MCG/ACT inhaler. Provider needs to provide the number of grams - we can not dispense this item as written as # 1. Pharmacist has called multiple times regarding this issue on other patients and has expressed some frustration of not being able to get through directly to the office.  Contact Center Agent attempted to connect them to the CAL but was advised to take a message for the clinical pool.

## 2023-07-03 NOTE — Telephone Encounter (Signed)
 Prescription changed, please give Arlys John the number to our office so he can call us directly.

## 2023-07-03 NOTE — Addendum Note (Signed)
 Addended by: Jannifer Rodney A on: 07/03/2023 01:50 PM   Modules accepted: Orders

## 2023-07-03 NOTE — Progress Notes (Signed)
 Virtual Visit Consent   Rhonda Kelly, you are scheduled for a virtual visit with a [facility] provider today. Just as with appointments in the office, your consent must be obtained to participate. Your consent will be active for this visit and any virtual visit you may have with one of our providers in the next 365 days. If you have a MyChart account, a copy of this consent can be sent to you electronically.  As this is a virtual visit, video technology does not allow for your provider to perform a traditional examination. This may limit your provider's ability to fully assess your condition. If your provider identifies any concerns that need to be evaluated in person or the need to arrange testing (such as labs, EKG, etc.), we will make arrangements to do so. Although advances in technology are sophisticated, we cannot ensure that it will always work on either your end or our end. If the connection with a video visit is poor, the visit may have to be switched to a telephone visit. With either a video or telephone visit, we are not always able to ensure that we have a secure connection.  By engaging in this virtual visit, you consent to the provision of healthcare and authorize for your insurance to be billed (if applicable) for the services provided during this visit. Depending on your insurance coverage, you may receive a charge related to this service.  I need to obtain your verbal consent now. Are you willing to proceed with your visit today? Rhonda Kelly has provided verbal consent on 07/03/2023 for a virtual visit (video or telephone). Bari Learn, FNP  Date: 07/03/2023 9:41 AM  Virtual Visit via Video Note   I, Bari Learn, connected with  Rhonda Kelly  ([id number], 05-09-1946) on 07/03/23 at  9:40 AM EST by a video-enabled telemedicine application and verified that I am speaking with the correct person using two identifiers.  Location: Patient: Virtual Visit Location Patient: Home Provider:  Virtual Visit Location Provider: Home Office   I discussed the limitations of evaluation and management by telemedicine and the availability of in person appointments. The patient expressed understanding and agreed to proceed.    History of Present Illness: Rhonda Kelly is a 78 y.o. who identifies as a female who was assigned female at birth, and is being seen today for chronic follow up.    She has aortic atherosclerosis and takes Lipitor daily. She has osteoporosis and takes fosamax  weekly. Her last Dexascan was 08/28/20.   She has COPD and continues to smoke 1/2 pack a day. Does not use any inhaler at this time.   HPI: Thyroid  Problem Presents for follow-up visit. Symptoms include fatigue. Patient reports no constipation, depressed mood, diaphoresis or dry skin. The symptoms have been stable. Her past medical history is significant for hyperlipidemia.  Hyperlipidemia This is a chronic problem. The current episode started more than 1 year ago. The problem is controlled. Current antihyperlipidemic treatment includes statins. The current treatment provides moderate improvement of lipids. Risk factors for coronary artery disease include dyslipidemia and a sedentary lifestyle.    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    Allergies: No Known Allergies Medications:  Current Outpatient Medications:    budesonide -formoterol  (SYMBICORT ) 160-4.5 MCG/ACT inhaler, Inhale 2 puffs into the lungs 2 (two) times daily., Disp: 1 each, Rfl: 3   alendronate   (FOSAMAX ) 70 MG tablet, TAKE 1 TABLET WEEKLY (TAKE WITH 8OZ OF WATER 30 MINUTES BEFORE BREAKFAST), Disp: 12 tablet, Rfl: 4   aspirin 81 MG tablet, Take 81 mg by mouth daily., Disp: , Rfl:    atorvastatin  (LIPITOR) 20 MG tablet, Take 1 tablet (20 mg total) by mouth daily., Disp: 90 tablet, Rfl: 3   BLACK ELDERBERRY PO, Take by mouth., Disp: , Rfl:    Cholecalciferol 1000 units TBDP, Take 1,000 Int'l Units by mouth daily., Disp: , Rfl:    Co-Enzyme Q-10 100 MG CAPS, Take 100 mg by mouth daily., Disp: , Rfl:    KRILL OIL PO, Take 1 capsule by mouth daily., Disp: , Rfl:    levothyroxine  (SYNTHROID ) 50 MCG tablet, TAKE 1 TABLET BY MOUTH EVERY MORNING BEFORE BREAKFAST, Disp: 90 tablet, Rfl: 1   multivitamin-lutein (OCUVITE-LUTEIN) CAPS capsule, Take 1 capsule by mouth daily. icaps Areds 2, Disp: , Rfl:    OVER THE COUNTER MEDICATION, Vitamin c with zinc, Disp: , Rfl:    RESTASIS 0.05 % ophthalmic emulsion, , Disp: , Rfl:   Observations/Objective: Patient is well-developed, well-nourished in no acute distress.  Resting comfortably  at home.  Head is normocephalic, atraumatic.  No labored breathing.  Speech is clear and coherent with logical content.  Patient is alert and oriented at baseline.    Assessment and Plan: 1. Osteoporosis, postmenopausal - alendronate  (FOSAMAX ) 70 MG tablet; TAKE 1 TABLET WEEKLY (TAKE WITH 8OZ OF WATER 30 MINUTES BEFORE BREAKFAST)  Dispense: 12 tablet; Refill: 4  2. Aortic atherosclerosis (HCC) - atorvastatin  (LIPITOR) 20 MG tablet; Take 1 tablet (20 mg total) by mouth daily.  Dispense: 90 tablet; Refill: 3  3. Hyperlipidemia, unspecified hyperlipidemia type - atorvastatin  (LIPITOR) 20 MG tablet; Take 1 tablet (20 mg total) by mouth daily.  Dispense: 90 tablet; Refill: 3  4. Hypothyroidism, unspecified type - levothyroxine  (SYNTHROID ) 50 MCG tablet; TAKE 1 TABLET BY MOUTH EVERY MORNING BEFORE BREAKFAST  Dispense: 90 tablet; Refill: 1  5. Tobacco abuse  6.  Pulmonary emphysema, unspecified emphysema type (HCC) (Primary) - budesonide -formoterol  (SYMBICORT ) 160-4.5 MCG/ACT inhaler; Inhale 2 puffs into the lungs 2 (two) times daily.  Dispense: 1 each; Refill: 3  Start Symbicort  BID Smoking cessation  Labs pending  Follow up 6 months   Follow Up Instructions: I discussed the assessment and treatment plan with the patient. The patient was provided an opportunity to ask questions and all were answered. The patient agreed with the plan and demonstrated an understanding of the instructions.  A copy of instructions were sent to the patient via MyChart unless otherwise noted below.     The patient was advised to call back or seek an in-person evaluation if the symptoms worsen or if the condition fails to improve as anticipated.    Bari Learn, FNP

## 2023-07-03 NOTE — Telephone Encounter (Signed)
 I contacted Arlys John and gave him a back line phone number. I was then told that we cannot do that until work flow is approved. I contacted Arlys John and informed that we have to wait using that number until work flow is approved.

## 2023-07-03 NOTE — Patient Instructions (Signed)
 Health Maintenance After Age 76 After age 27, you are at a higher risk for certain long-term diseases and infections as well as injuries from falls. Falls are a major cause of broken bones and head injuries in people who are older than age 73. Getting regular preventive care can help to keep you healthy and well. Preventive care includes getting regular testing and making lifestyle changes as recommended by your health care provider. Talk with your health care provider about: Which screenings and tests you should have. A screening is a test that checks for a disease when you have no symptoms. A diet and exercise plan that is right for you. What should I know about screenings and tests to prevent falls? Screening and testing are the best ways to find a health problem early. Early diagnosis and treatment give you the best chance of managing medical conditions that are common after age 90. Certain conditions and lifestyle choices may make you more likely to have a fall. Your health care provider may recommend: Regular vision checks. Poor vision and conditions such as cataracts can make you more likely to have a fall. If you wear glasses, make sure to get your prescription updated if your vision changes. Medicine review. Work with your health care provider to regularly review all of the medicines you are taking, including over-the-counter medicines. Ask your health care provider about any side effects that may make you more likely to have a fall. Tell your health care provider if any medicines that you take make you feel dizzy or sleepy. Strength and balance checks. Your health care provider may recommend certain tests to check your strength and balance while standing, walking, or changing positions. Foot health exam. Foot pain and numbness, as well as not wearing proper footwear, can make you more likely to have a fall. Screenings, including: Osteoporosis screening. Osteoporosis is a condition that causes  the bones to get weaker and break more easily. Blood pressure screening. Blood pressure changes and medicines to control blood pressure can make you feel dizzy. Depression screening. You may be more likely to have a fall if you have a fear of falling, feel depressed, or feel unable to do activities that you used to do. Alcohol  use screening. Using too much alcohol  can affect your balance and may make you more likely to have a fall. Follow these instructions at home: Lifestyle Do not drink alcohol  if: Your health care provider tells you not to drink. If you drink alcohol : Limit how much you have to: 0-1 drink a day for women. 0-2 drinks a day for men. Know how much alcohol  is in your drink. In the [place] one drink equals one 12 oz bottle of beer (355 mL), one 5 oz glass of wine (148 mL), or one 1 oz glass of hard liquor (44 mL). Do not use any products that contain nicotine or tobacco. These products include cigarettes, chewing tobacco, and vaping devices, such as e-cigarettes. If you need help quitting, ask your health care provider. Activity  Follow a regular exercise program to stay fit. This will help you maintain your balance. Ask your health care provider what types of exercise are appropriate for you. If you need a cane or walker, use it as recommended by your health care provider. Wear supportive shoes that have nonskid soles. Safety  Remove any tripping hazards, such as rugs, cords, and clutter. Install safety equipment such as grab bars in bathrooms and safety rails on stairs. Keep rooms and walkways  well-lit. General instructions Talk with your health care provider about your risks for falling. Tell your health care provider if: You fall. Be sure to tell your health care provider about all falls, even ones that seem minor. You feel dizzy, tiredness (fatigue), or off-balance. Take over-the-counter and prescription medicines only as told by your health care provider. These include  supplements. Eat a healthy diet and maintain a healthy weight. A healthy diet includes low-fat dairy products, low-fat (lean) meats, and fiber from whole grains, beans, and lots of fruits and vegetables. Stay current with your vaccines. Schedule regular health, dental, and eye exams. Summary Having a healthy lifestyle and getting preventive care can help to protect your health and wellness after age 15. Screening and testing are the best way to find a health problem early and help you avoid having a fall. Early diagnosis and treatment give you the best chance for managing medical conditions that are more common for people who are older than age 42. Falls are a major cause of broken bones and head injuries in people who are older than age 64. Take precautions to prevent a fall at home. Work with your health care provider to learn what changes you can make to improve your health and wellness and to prevent falls. This information is not intended to replace advice given to you by your health care provider. Make sure you discuss any questions you have with your health care provider. Document Revised: 11/05/2020 Document Reviewed: 11/05/2020 Elsevier Patient Education  2024 [employer].

## 2023-07-03 NOTE — Telephone Encounter (Signed)
 This was already done

## 2023-07-07 ENCOUNTER — Other Ambulatory Visit: Payer: Medicare Other

## 2023-07-07 DIAGNOSIS — E785 Hyperlipidemia, unspecified: Secondary | ICD-10-CM | POA: Diagnosis not present

## 2023-07-08 LAB — LIPID PANEL
Chol/HDL Ratio: 2.9 {ratio} (ref 0.0–4.4)
Cholesterol, Total: 215 mg/dL — ABNORMAL HIGH (ref 100–199)
HDL: 73 mg/dL (ref >39)
LDL Chol Calc (NIH): 123 mg/dL — ABNORMAL HIGH (ref 0–99)
Triglycerides: 107 mg/dL (ref 0–149)
VLDL Cholesterol Cal: 19 mg/dL (ref 5–40)

## 2023-07-08 LAB — CMP14+EGFR
ALT: 16 [IU]/L (ref 0–32)
AST: 25 [IU]/L (ref 0–40)
Albumin: 4.7 g/dL (ref 3.8–4.8)
Alkaline Phosphatase: 88 [IU]/L (ref 44–121)
BUN/Creatinine Ratio: 11 — ABNORMAL LOW (ref 12–28)
BUN: 7 mg/dL — ABNORMAL LOW (ref 8–27)
Bilirubin Total: 0.4 mg/dL (ref 0.0–1.2)
CO2: 25 mmol/L (ref 20–29)
Calcium: 9.7 mg/dL (ref 8.7–10.3)
Chloride: 98 mmol/L (ref 96–106)
Creatinine, Ser: 0.66 mg/dL (ref 0.57–1.00)
Globulin, Total: 2 g/dL (ref 1.5–4.5)
Glucose: 103 mg/dL — ABNORMAL HIGH (ref 70–99)
Potassium: 4.4 mmol/L (ref 3.5–5.2)
Sodium: 138 mmol/L (ref 134–144)
Total Protein: 6.7 g/dL (ref 6.0–8.5)
eGFR: 91 mL/min/{1.73_m2} (ref >59)

## 2023-07-08 LAB — CBC WITH DIFFERENTIAL/PLATELET
Basophils Absolute: 0.1 10*3/uL (ref 0.0–0.2)
Basos: 1 %
EOS (ABSOLUTE): 0.1 10*3/uL (ref 0.0–0.4)
Eos: 1 %
Hematocrit: 45.3 % (ref 34.0–46.6)
Hemoglobin: 14.8 g/dL (ref 11.1–15.9)
Immature Grans (Abs): 0 10*3/uL (ref 0.0–0.1)
Immature Granulocytes: 0 %
Lymphocytes Absolute: 2.1 10*3/uL (ref 0.7–3.1)
Lymphs: 28 %
MCH: 29.7 pg (ref 26.6–33.0)
MCHC: 32.7 g/dL (ref 31.5–35.7)
MCV: 91 fL (ref 79–97)
Monocytes Absolute: 0.5 10*3/uL (ref 0.1–0.9)
Monocytes: 7 %
Neutrophils Absolute: 4.7 10*3/uL (ref 1.4–7.0)
Neutrophils: 63 %
Platelets: 219 10*3/uL (ref 150–450)
RBC: 4.99 x10E6/uL (ref 3.77–5.28)
RDW: 13 % (ref 11.7–15.4)
WBC: 7.4 10*3/uL (ref 3.4–10.8)

## 2023-07-20 ENCOUNTER — Ambulatory Visit (INDEPENDENT_AMBULATORY_CARE_PROVIDER_SITE_OTHER): Payer: Medicare Other

## 2023-07-20 VITALS — Ht 61.0 in | Wt 108.0 lb

## 2023-07-20 DIAGNOSIS — Z0000 Encounter for general adult medical examination without abnormal findings: Secondary | ICD-10-CM | POA: Diagnosis not present

## 2023-07-20 NOTE — Patient Instructions (Signed)
Ms. Kolinsky , Thank you for taking time to come for your Medicare Wellness Visit. I appreciate your ongoing commitment to your health goals. Please review the following plan we discussed and let me know if I can assist you in the future.   Referrals/Orders/Follow-Ups/Clinician Recommendations: Aim for 30 minutes of exercise or brisk walking, 6-8 glasses of water, and 5 servings of fruits and vegetables each day.  This is a list of the screening recommended for you and due dates:  Health Maintenance  Topic Date Due   COVID-19 Vaccine (7 - 2024-25 season) 06/05/2023   Screening for Lung Cancer  08/12/2023   Mammogram  09/01/2023   Medicare Annual Wellness Visit  07/19/2024   DEXA scan (bone density measurement)  08/31/2024   Colon Cancer Screening  10/18/2025   DTaP/Tdap/Td vaccine (2 - Td or Tdap) 09/19/2029   Pneumonia Vaccine  Completed   Flu Shot  Completed   Hepatitis C Screening  Completed   Zoster (Shingles) Vaccine  Completed   HPV Vaccine  Aged Out    Advanced directives: (ACP Link)Information on Advanced Care Planning can be found at [facility] Advance Health Care Directives Advance Health Care Directives ([email or web address]   Next Medicare Annual Wellness Visit scheduled for next year: Yes

## 2023-07-20 NOTE — Progress Notes (Signed)
Subjective:   Rhonda Kelly is a 78 y.o. female who presents for Medicare Annual (Subsequent) preventive examination.  Visit Complete: Virtual I connected with  Valeka A Konieczny on 07/20/23 by a audio enabled telemedicine application and verified that I am speaking with the correct person using two identifiers.  Patient Location: Home  Provider Location: Home Office  This patient declined Interactive audio and video telecommunications. Therefore the visit was completed with audio only.  I discussed the limitations of evaluation and management by telemedicine. The patient expressed understanding and agreed to proceed.  Vital Signs: Because this visit was a virtual/telehealth visit, some criteria may be missing or patient reported. Any vitals not documented were not able to be obtained and vitals that have been documented are patient reported.  Patient Medicare AWV questionnaire was completed by the patient on 07/19/23; I have confirmed that all information answered by patient is correct and no changes since this date.  Cardiac Risk Factors include: advanced age (>51men, >59 women);dyslipidemia;smoking/ tobacco exposure     Objective:    Today's Vitals   07/20/23 1356  Weight: 108 lb (49 kg)  Height: 5\' 1"  (1.549 m)   Body mass index is 20.41 kg/m.     07/20/2023    1:59 PM 07/16/2022    8:58 AM 07/15/2021    9:19 AM 07/12/2020    9:16 AM 07/12/2019    9:42 AM 07/09/2018   12:16 PM 07/08/2017   11:33 AM  Advanced Directives  Does Patient Have a Medical Advance Directive? No Yes No Yes Yes Yes   Type of Furniture conservator/restorer;Living will  Healthcare Power of [place];Living will Healthcare Power of [place];Living will Healthcare Power of [place];Living will   Does patient want to make changes to medical advance directive?    No - Patient declined No - Patient declined No - Patient declined   Copy of Healthcare Power of Attorney in Chart?  No - copy requested  No  - copy requested No - copy requested No - copy requested No - copy requested  Would patient like information on creating a medical advance directive? Yes (MAU/Ambulatory/Procedural Areas - Information given)  No - Patient declined        Current Medications (verified) Outpatient Encounter Medications as of 07/20/2023  Medication Sig   alendronate (FOSAMAX) 70 MG tablet TAKE 1 TABLET WEEKLY (TAKE WITH 8OZ OF WATER 30 MINUTES BEFORE BREAKFAST)   aspirin 81 MG tablet Take 81 mg by mouth daily.   atorvastatin (LIPITOR) 20 MG tablet Take 1 tablet (20 mg total) by mouth daily.   BLACK ELDERBERRY PO Take by mouth.   budesonide-formoterol (SYMBICORT) 160-4.5 MCG/ACT inhaler Inhale 2 puffs into the lungs 2 (two) times daily.   Cholecalciferol 1000 units TBDP Take 1,000 Int'l Units by mouth daily.   Co-Enzyme Q-10 100 MG CAPS Take 100 mg by mouth daily.   KRILL OIL PO Take 1 capsule by mouth daily.   levothyroxine (SYNTHROID) 50 MCG tablet TAKE 1 TABLET BY MOUTH EVERY MORNING BEFORE BREAKFAST   multivitamin-lutein (OCUVITE-LUTEIN) CAPS capsule Take 1 capsule by mouth daily. icaps Areds 2   OVER THE COUNTER MEDICATION Vitamin c with zinc   RESTASIS 0.05 % ophthalmic emulsion    No facility-administered encounter medications on file as of 07/20/2023.    Allergies (verified) Patient has no known allergies.   History: Past Medical History:  Diagnosis Date   Allergy    some seasonal- otc meds prn only  Bladder prolapse, female, acquired    surgery pending to repair 09-24-15   Cancer [facility])    skin cancer   Cataract    small   Colon polyp    Colon polyp    Diverticulosis    GERD (gastroesophageal reflux disease)    Hyperlipidemia    Macular degeneration    Osteoporosis    Thyroid disease    25 years ago- no meds now    Past Surgical History:  Procedure Laterality Date   ABDOMINAL HYSTERECTOMY Bilateral 1993   leiomyomata, bleeding   COLONOSCOPY     FOOT SURGERY  1977   right    MOUTH SURGERY     dental implants x 2   OOPHORECTOMY     BSO   POLYPECTOMY     SQUAMOUS CELL CARCINOMA EXCISION Left 2018   SCC removed from upper left leg   TONSILLECTOMY  1972   TUBAL LIGATION     Family History  Problem Relation Age of Onset   Colon cancer Mother 67   Heart attack Mother        during gall bladder surgery   Heart disease Mother    Cancer Father        prostate   Diabetes Sister    Colon polyps Sister    Liver disease Daughter        States recent labs were normal   Colon cancer Maternal Aunt    Colon cancer Maternal Uncle    Colon cancer Maternal Grandmother 15   Stroke Brother    Heart attack Brother    CAD Brother    Heart attack Brother    CAD Brother    Diabetes Brother    Stomach cancer Neg Hx    Esophageal cancer Neg Hx    Pancreatic cancer Neg Hx    Breast cancer Neg Hx    Social History   Socioeconomic History   Marital status: Married    Spouse name: Chrissie Noa   Number of children: 1   Years of education: 14   Highest education level: Some college, no degree  Occupational History   Occupation: Retired    Associate Professor: [employer]    Comment: Retired in 2009  Tobacco Use   Smoking status: Every Day    Current packs/day: 0.50    Average packs/day: 0.5 packs/day for 45.0 years (22.5 ttl pk-yrs)    Types: Cigarettes   Smokeless tobacco: Never  Vaping Use   Vaping status: Never Used  Substance and Sexual Activity   Alcohol use: Never   Drug use: No   Sexual activity: Not Currently    Birth control/protection: Surgical    Comment: 1st intercourse 78 yo-Fewer than 5 partners  Other Topics Concern   Not on file  Social History Narrative   Lives with her husband in a one level home [place] farm - they stay busy doing farm work, Environmental education officer and carrying wood, Catering manager   Social Drivers of Health   Financial Resource Strain: Low Risk  (07/20/2023)   Overall Financial Resource Strain (CARDIA)    Difficulty of Paying Living Expenses: Not hard at all  Food  Insecurity: No Food Insecurity (07/20/2023)   Hunger Vital Sign    Worried About Running Out of Food in the Last Year: Never true    Ran Out of Food in the Last Year: Never true  Transportation Needs: No Transportation Needs (07/20/2023)   PRAPARE - Administrator, Civil Service (Medical): No  Lack of Transportation (Non-Medical): No  Physical Activity: Sufficiently Active (07/20/2023)   Exercise Vital Sign    Days of Exercise per Week: 3 days    Minutes of Exercise per Session: 60 min  Stress: No Stress Concern Present (07/20/2023)   [employer] of Occupational Health - Occupational Stress Questionnaire    Feeling of Stress : Not at all  Social Connections: Socially Integrated (07/20/2023)   Social Connection and Isolation Panel [NHANES]    Frequency of Communication with Friends and Family: More than three times a week    Frequency of Social Gatherings with Friends and Family: Three times a week    Attends Religious Services: More than 4 times per year    Active Member of Clubs or Organizations: Yes    Attends Banker Meetings: 1 to 4 times per year    Marital Status: Married    Tobacco Counseling Ready to quit: No Counseling given: Not Answered   Clinical Intake:  Pre-visit preparation completed: Yes  Pain : No/denies pain     Diabetes: No  How often do you need to have someone help you when you read instructions, pamphlets, or other written materials from your doctor or pharmacy?: 1 - Never  Interpreter Needed?: No  Information entered by :: Kandis Fantasia LPN   Activities of Daily Living    07/19/2023    8:55 PM  In your present state of health, do you have any difficulty performing the following activities:  Hearing? 0  Vision? 0  Difficulty concentrating or making decisions? 0  Walking or climbing stairs? 0  Dressing or bathing? 0  Doing errands, shopping? 0  Preparing Food and eating ? N  Using the Toilet? N  In the past  six months, have you accidently leaked urine? N  Do you have problems with loss of bowel control? N  Managing your Medications? N  Managing your Finances? N  Housekeeping or managing your Housekeeping? N    Patient Care Team: Junie Spencer, FNP as PCP - General (Family Medicine) Michaelle Copas, MD as Referring Physician (Optometry) Hilarie Fredrickson, MD as Consulting Physician (Gastroenterology)  Indicate any recent Medical Services you may have received from other than [facility] providers in the past year (date may be approximate).     Assessment:   This is a routine wellness examination for Liesl.  Hearing/Vision screen Hearing Screening - Comments:: Denies hearing difficulties   Vision Screening - Comments:: Wears rx glasses - up to date with routine eye exams with [facility])     Goals Addressed             This Visit's Progress    COMPLETED: AWV       07/12/2020 AWV Goal: Fall Prevention  Over the next year, patient will decrease their risk for falls by: Using assistive devices, such as a cane or walker, as needed Identifying fall risks within their home and correcting them by: Removing throw rugs Adding handrails to stairs or ramps Removing clutter and keeping a clear pathway throughout the home Increasing light, especially at night Adding shower handles/bars Raising toilet seat Identifying potential personal risk factors for falls: Medication side effects Incontinence/urgency Vestibular dysfunction Hearing loss Musculoskeletal disorders Neurological disorders Orthostatic hypotension        Depression Screen    07/20/2023    1:58 PM 10/09/2022    3:53 PM 07/16/2022    8:57 AM 01/06/2022   10:22 AM 07/15/2021  9:15 AM 07/10/2021    4:05 PM 08/28/2020   10:29 AM  PHQ 2/9 Scores  PHQ - 2 Score 0 0 0 0 1 0 0  PHQ- 9 Score    2 1 0     Fall Risk    07/20/2023    1:58 PM 07/19/2023    8:55 PM 07/16/2022    8:56 AM 01/06/2022   10:21 AM  07/15/2021    9:19 AM  Fall Risk   Falls in the past year? 0 0 0 0 0  Number falls in past yr: 0  0  0  Injury with Fall? 0  0  0  Risk for fall due to : No Fall Risks  No Fall Risks  No Fall Risks  Follow up Falls prevention discussed;Education provided;Falls evaluation completed  Falls prevention discussed  Falls prevention discussed    MEDICARE RISK AT HOME: Medicare Risk at Home Any stairs in or around the home?: (Patient-Rptd) Yes If so, are there any without handrails?: No Home free of loose throw rugs in walkways, pet beds, electrical cords, etc?: (Patient-Rptd) Yes Adequate lighting in your home to reduce risk of falls?: (Patient-Rptd) Yes Life alert?: (Patient-Rptd) No Use of a cane, walker or w/c?: (Patient-Rptd) No Grab bars in the bathroom?: (Patient-Rptd) Yes Shower chair or bench in shower?: (Patient-Rptd) No Elevated toilet seat or a handicapped toilet?: (Patient-Rptd) No  TIMED UP AND GO:  Was the test performed?  No    Cognitive Function:    07/08/2017   11:31 AM 02/05/2015   12:18 PM  MMSE - Mini Mental State Exam  Orientation to time 5 5  Orientation to Place 5 5  Registration 3 3  Attention/ Calculation 5 5  Recall 3 3  Language- name 2 objects 2 2  Language- repeat 1 1  Language- follow 3 step command 3 3  Language- read & follow direction 1 1  Write a sentence 1 1  Copy design 1 1  Total score 30 30        07/20/2023    1:59 PM 07/16/2022    8:59 AM 07/12/2020    9:18 AM 07/12/2019    9:47 AM 06/12/2016    3:42 PM  6CIT Screen  What Year? 0 points 0 points 0 points 0 points 0 points  What month? 0 points 0 points 0 points 0 points 0 points  What time? 0 points 0 points 0 points 0 points 0 points  Count back from 20 0 points 0 points 0 points 0 points 0 points  Months in reverse 0 points 0 points 0 points 0 points 0 points  Repeat phrase 0 points 0 points 0 points 0 points 0 points  Total Score 0 points 0 points 0 points 0 points 0 points     Immunizations Immunization History  Administered Date(s) Administered   Fluad Quad(high Dose 65+) 04/30/2022   Fluad Trivalent(High Dose 65+) 04/10/2023   Influenza, High Dose Seasonal PF 06/12/2016, 04/20/2017, 05/10/2018   Influenza,inj,Quad PF,6+ Mos 05/03/2014, 04/10/2015, 05/01/2019   Influenza-Unspecified 04/29/2021   Moderna Covid-19 Vaccine Bivalent Booster 85yrs & up 04/10/2023   Moderna Sars-Covid-2 Vaccination 07/21/2019, 08/26/2019, 04/28/2020, 04/27/2021, 05/09/2022   Pneumococcal Conjugate-13 05/03/2014   Pneumococcal Polysaccharide-23 07/31/2015   Tdap 09/20/2019   Zoster Recombinant(Shingrix) 07/08/2017, 07/09/2018   Zoster, Live 02/05/2015    TDAP status: Up to date  Flu Vaccine status: Up to date  Pneumococcal vaccine status: Up to date  Covid-19 vaccine status: Completed  vaccines  Qualifies for Shingles Vaccine? Yes   Zostavax completed Yes   Shingrix Completed?: Yes  Screening Tests Health Maintenance  Topic Date Due   COVID-19 Vaccine (7 - 2024-25 season) 06/05/2023   Lung Cancer Screening  08/12/2023   MAMMOGRAM  09/01/2023   Medicare Annual Wellness (AWV)  07/19/2024   DEXA SCAN  08/31/2024   Colonoscopy  10/18/2025   DTaP/Tdap/Td (2 - Td or Tdap) 09/19/2029   Pneumonia Vaccine 40+ Years old  Completed   INFLUENZA VACCINE  Completed   Hepatitis C Screening  Completed   Zoster Vaccines- Shingrix  Completed   HPV VACCINES  Aged Out    Health Maintenance  Health Maintenance Due  Topic Date Due   COVID-19 Vaccine (7 - 2024-25 season) 06/05/2023   Lung Cancer Screening  08/12/2023    Colorectal cancer screening: Type of screening: Colonoscopy. Completed 10/18/20. Repeat every 5 years  Mammogram status: Completed 09/01/22. Repeat every year  Bone Density status: Completed 09/01/22. Results reflect: Bone density results: OSTEOPOROSIS. Repeat every 2 years.  Lung Cancer Screening: (Low Dose CT Chest recommended if Age 78-80 years, 20  pack-year currently smoking OR have quit w/in 15years.) does qualify.   Lung Cancer Screening Referral: last 08/11/22  Additional Screening:  Hepatitis C Screening: does qualify; Completed 02/05/15  Vision Screening: Recommended annual ophthalmology exams for early detection of glaucoma and other disorders of the eye. Is the patient up to date with their annual eye exam?  Yes  Who is the provider or what is the name of the office in which the patient attends annual eye exams? Dr. Conley Rolls  If pt is not established with a provider, would they like to be referred to a provider to establish care? No .   Dental Screening: Recommended annual dental exams for proper oral hygiene  Community Resource Referral / Chronic Care Management: CRR required this visit?  No   CCM required this visit?  No     Plan:     I have personally reviewed and noted the following in the patient's chart:   Medical and social history Use of alcohol, tobacco or illicit drugs  Current medications and supplements including opioid prescriptions. Patient is not currently taking opioid prescriptions. Functional ability and status Nutritional status Physical activity Advanced directives List of other physicians Hospitalizations, surgeries, and ER visits in previous 12 months Vitals Screenings to include cognitive, depression, and falls Referrals and appointments  In addition, I have reviewed and discussed with patient certain preventive protocols, quality metrics, and best practice recommendations. A written personalized care plan for preventive services as well as general preventive health recommendations were provided to patient.     Kandis Fantasia [place], [place]   3/[birth year]31   After Visit Summary: (MyChart) Due to this being a telephonic visit, the after visit summary with patients personalized plan was offered to patient via MyChart   Nurse Notes: No concerns at this time

## 2023-08-12 ENCOUNTER — Ambulatory Visit: Payer: Medicare Other

## 2023-08-12 ENCOUNTER — Ambulatory Visit (HOSPITAL_COMMUNITY)
Admission: RE | Admit: 2023-08-12 | Discharge: 2023-08-12 | Disposition: A | Discharge status: Home / Self Care | Payer: Medicare Other | Source: Ambulatory Visit | Attending: Physician Assistant | Admitting: Physician Assistant

## 2023-08-12 DIAGNOSIS — Z87891 Personal history of nicotine dependence: Secondary | ICD-10-CM | POA: Diagnosis not present

## 2023-08-12 DIAGNOSIS — F17210 Nicotine dependence, cigarettes, uncomplicated: Secondary | ICD-10-CM | POA: Diagnosis not present

## 2023-08-12 DIAGNOSIS — Z122 Encounter for screening for malignant neoplasm of respiratory organs: Secondary | ICD-10-CM | POA: Insufficient documentation

## 2023-08-31 NOTE — Progress Notes (Signed)
Patient notified of LDCT Lung Cancer Screening Results via mail with the recommendation to follow-up in 12 months. Patient's referring provider has been sent a copy of results. Results are as follows:  IMPRESSION: 1. Lung-RADS 2, benign appearance or behavior. Continue annual screening with low-dose chest CT without contrast in 12 months. 2. Aortic atherosclerosis (ICD10-I70.0). Coronary artery calcification. 3.  Emphysema (ICD10-J43.9).  

## 2023-10-26 ENCOUNTER — Telehealth: Payer: Self-pay | Admitting: Family Medicine

## 2023-10-26 NOTE — Telephone Encounter (Signed)
 Please call patient with appt on the mammo bus here.      Copied from CRM [id number]. Topic: Clinical - Request for Lab/Test Order >> Oct 26, 2023  1:28 PM Rhonda Kelly wrote: Reason for CRM: Patient requesting mammogram- please call [phone] or [phone]

## 2023-11-10 ENCOUNTER — Other Ambulatory Visit: Payer: Self-pay | Admitting: Family

## 2023-11-10 DIAGNOSIS — Z1231 Encounter for screening mammogram for malignant neoplasm of breast: Secondary | ICD-10-CM

## 2023-11-21 ENCOUNTER — Other Ambulatory Visit: Payer: Self-pay | Admitting: Medical Genetics

## 2023-11-25 ENCOUNTER — Ambulatory Visit
Admission: RE | Admit: 2023-11-25 | Discharge: 2023-11-25 | Disposition: A | Discharge status: Home / Self Care | Source: Ambulatory Visit | Attending: Family | Admitting: Family

## 2023-11-25 ENCOUNTER — Other Ambulatory Visit (HOSPITAL_COMMUNITY)
Admission: RE | Admit: 2023-11-25 | Discharge: 2023-11-25 | Disposition: A | Discharge status: Home / Self Care | Payer: Self-pay | Source: Ambulatory Visit | Attending: Medical Genetics | Admitting: Medical Genetics

## 2023-11-25 DIAGNOSIS — Z1231 Encounter for screening mammogram for malignant neoplasm of breast: Secondary | ICD-10-CM

## 2023-11-30 ENCOUNTER — Other Ambulatory Visit: Payer: Self-pay | Admitting: Family

## 2023-11-30 DIAGNOSIS — E039 Hypothyroidism, unspecified: Secondary | ICD-10-CM

## 2023-12-04 LAB — GENECONNECT MOLECULAR SCREEN: Genetic Analysis Overall Interpretation: NEGATIVE

## 2024-03-02 ENCOUNTER — Encounter: Payer: Self-pay | Admitting: Family

## 2024-03-02 ENCOUNTER — Other Ambulatory Visit: Payer: Self-pay | Admitting: Family

## 2024-03-02 DIAGNOSIS — E039 Hypothyroidism, unspecified: Secondary | ICD-10-CM

## 2024-03-02 NOTE — Telephone Encounter (Signed)
 Rhonda Kelly NTBS Last OV in Jan Last TSH 09/05/22 NO RF sent to pharmacy 30-d given 11/30/23

## 2024-03-02 NOTE — Telephone Encounter (Signed)
 Letter sent

## 2024-03-22 ENCOUNTER — Ambulatory Visit (INDEPENDENT_AMBULATORY_CARE_PROVIDER_SITE_OTHER)

## 2024-03-22 ENCOUNTER — Encounter: Payer: Self-pay | Admitting: Family

## 2024-03-22 ENCOUNTER — Ambulatory Visit (INDEPENDENT_AMBULATORY_CARE_PROVIDER_SITE_OTHER): Admitting: Family

## 2024-03-22 VITALS — BP 146/69 | HR 67 | Temp 97.6°F

## 2024-03-22 DIAGNOSIS — R102 Pelvic and perineal pain: Secondary | ICD-10-CM | POA: Diagnosis not present

## 2024-03-22 DIAGNOSIS — Z720 Tobacco use: Secondary | ICD-10-CM

## 2024-03-22 DIAGNOSIS — M810 Age-related osteoporosis without current pathological fracture: Secondary | ICD-10-CM | POA: Diagnosis not present

## 2024-03-22 DIAGNOSIS — M25551 Pain in right hip: Secondary | ICD-10-CM

## 2024-03-22 DIAGNOSIS — E785 Hyperlipidemia, unspecified: Secondary | ICD-10-CM | POA: Diagnosis not present

## 2024-03-22 DIAGNOSIS — I700 Atherosclerosis of aorta: Secondary | ICD-10-CM | POA: Diagnosis not present

## 2024-03-22 DIAGNOSIS — R030 Elevated blood-pressure reading, without diagnosis of hypertension: Secondary | ICD-10-CM | POA: Diagnosis not present

## 2024-03-22 DIAGNOSIS — J439 Emphysema, unspecified: Secondary | ICD-10-CM | POA: Diagnosis not present

## 2024-03-22 DIAGNOSIS — M160 Bilateral primary osteoarthritis of hip: Secondary | ICD-10-CM | POA: Diagnosis not present

## 2024-03-22 DIAGNOSIS — E039 Hypothyroidism, unspecified: Secondary | ICD-10-CM | POA: Diagnosis not present

## 2024-03-22 MED ORDER — LEVOTHYROXINE SODIUM 50 MCG PO TABS: ORAL_TABLET | ORAL | 1 refills | Status: AC

## 2024-03-22 MED ORDER — ALENDRONATE SODIUM 70 MG PO TABS
ORAL_TABLET | ORAL | 4 refills | Status: AC
Start: 2024-03-22 — End: ?

## 2024-03-22 MED ORDER — BUDESONIDE-FORMOTEROL FUMARATE 160-4.5 MCG/ACT IN AERO: 2.0000 | INHALATION_SPRAY | Freq: Two times a day (BID) | RESPIRATORY_TRACT | 3 refills | Status: AC

## 2024-03-22 MED ORDER — ATORVASTATIN CALCIUM 20 MG PO TABS: 20.0000 mg | ORAL_TABLET | Freq: Every day | ORAL | 3 refills | Status: AC

## 2024-03-22 MED ORDER — DICLOFENAC SODIUM 75 MG PO TBEC
75.0000 mg | DELAYED_RELEASE_TABLET | Freq: Two times a day (BID) | ORAL | 2 refills | Status: AC
Start: 2024-03-22 — End: ?

## 2024-03-22 NOTE — Patient Instructions (Signed)
 Hypertension, Adult High blood pressure (hypertension) is when the force of blood pumping through the arteries is too strong. The arteries are the blood vessels that carry blood from the heart throughout the body. Hypertension forces the heart to work harder to pump blood and may cause arteries to become narrow or stiff. Untreated or uncontrolled hypertension can lead to a heart attack, heart failure, a stroke, kidney disease, and other problems. A blood pressure reading consists of a higher number over a lower number. Ideally, your blood pressure should be below 120/80. The first ("top") number is called the systolic pressure. It is a measure of the pressure in your arteries as your heart beats. The second ("bottom") number is called the diastolic pressure. It is a measure of the pressure in your arteries as the heart relaxes. What are the causes? The exact cause of this condition is not known. There are some conditions that result in high blood pressure. What increases the risk? Certain factors may make you more likely to develop high blood pressure. Some of these risk factors are under your control, including: Smoking. Not getting enough exercise or physical activity. Being overweight. Having too much fat, sugar, calories, or salt (sodium) in your diet. Drinking too much alcohol. Other risk factors include: Having a personal history of heart disease, diabetes, high cholesterol, or kidney disease. Stress. Having a family history of high blood pressure and high cholesterol. Having obstructive sleep apnea. Age. The risk increases with age. What are the signs or symptoms? High blood pressure may not cause symptoms. Very high blood pressure (hypertensive crisis) may cause: Headache. Fast or irregular heartbeats (palpitations). Shortness of breath. Nosebleed. Nausea and vomiting. Vision changes. Severe chest pain, dizziness, and seizures. How is this diagnosed? This condition is diagnosed by  measuring your blood pressure while you are seated, with your arm resting on a flat surface, your legs uncrossed, and your feet flat on the floor. The cuff of the blood pressure monitor will be placed directly against the skin of your upper arm at the level of your heart. Blood pressure should be measured at least twice using the same arm. Certain conditions can cause a difference in blood pressure between your right and left arms. If you have a high blood pressure reading during one visit or you have normal blood pressure with other risk factors, you may be asked to: Return on a different day to have your blood pressure checked again. Monitor your blood pressure at home for 1 week or longer. If you are diagnosed with hypertension, you may have other blood or imaging tests to help your health care provider understand your overall risk for other conditions. How is this treated? This condition is treated by making healthy lifestyle changes, such as eating healthy foods, exercising more, and reducing your alcohol intake. You may be referred for counseling on a healthy diet and physical activity. Your health care provider may prescribe medicine if lifestyle changes are not enough to get your blood pressure under control and if: Your systolic blood pressure is above 130. Your diastolic blood pressure is above 80. Your personal target blood pressure may vary depending on your medical conditions, your age, and other factors. Follow these instructions at home: Eating and drinking  Eat a diet that is high in fiber and potassium, and low in sodium, added sugar, and fat. An example of this eating plan is called the DASH diet. DASH stands for Dietary Approaches to Stop Hypertension. To eat this way: Eat  plenty of fresh fruits and vegetables. Try to fill one half of your plate at each meal with fruits and vegetables. Eat whole grains, such as whole-wheat pasta, brown rice, or whole-grain bread. Fill about one  fourth of your plate with whole grains. Eat or drink low-fat dairy products, such as skim milk or low-fat yogurt. Avoid fatty cuts of meat, processed or cured meats, and poultry with skin. Fill about one fourth of your plate with lean proteins, such as fish, chicken without skin, beans, eggs, or tofu. Avoid pre-made and processed foods. These tend to be higher in sodium, added sugar, and fat. Reduce your daily sodium intake. Many people with hypertension should eat less than 1,500 mg of sodium a day. Do not drink alcohol if: Your health care provider tells you not to drink. You are pregnant, may be pregnant, or are planning to become pregnant. If you drink alcohol: Limit how much you have to: 0-1 drink a day for women. 0-2 drinks a day for men. Know how much alcohol is in your drink. In the [place] one drink equals one 12 oz bottle of beer (355 mL), one 5 oz glass of wine (148 mL), or one 1 oz glass of hard liquor (44 mL). Lifestyle  Work with your health care provider to maintain a healthy body weight or to lose weight. Ask what an ideal weight is for you. Get at least 30 minutes of exercise that causes your heart to beat faster (aerobic exercise) most days of the week. Activities may include walking, swimming, or biking. Include exercise to strengthen your muscles (resistance exercise), such as Pilates or lifting weights, as part of your weekly exercise routine. Try to do these types of exercises for 30 minutes at least 3 days a week. Do not use any products that contain nicotine or tobacco. These products include cigarettes, chewing tobacco, and vaping devices, such as e-cigarettes. If you need help quitting, ask your health care provider. Monitor your blood pressure at home as told by your health care provider. Keep all follow-up visits. This is important. Medicines Take over-the-counter and prescription medicines only as told by your health care provider. Follow directions carefully. Blood  pressure medicines must be taken as prescribed. Do not skip doses of blood pressure medicine. Doing this puts you at risk for problems and can make the medicine less effective. Ask your health care provider about side effects or reactions to medicines that you should watch for. Contact a health care provider if you: Think you are having a reaction to a medicine you are taking. Have headaches that keep coming back (recurring). Feel dizzy. Have swelling in your ankles. Have trouble with your vision. Get help right away if you: Develop a severe headache or confusion. Have unusual weakness or numbness. Feel faint. Have severe pain in your chest or abdomen. Vomit repeatedly. Have trouble breathing. These symptoms may be an emergency. Get help right away. Call 911. Do not wait to see if the symptoms will go away. Do not drive yourself to the hospital. Summary Hypertension is when the force of blood pumping through your arteries is too strong. If this condition is not controlled, it may put you at risk for serious complications. Your personal target blood pressure may vary depending on your medical conditions, your age, and other factors. For most people, a normal blood pressure is less than 120/80. Hypertension is treated with lifestyle changes, medicines, or a combination of both. Lifestyle changes include losing weight, eating a healthy,  low-sodium diet, exercising more, and limiting alcohol. This information is not intended to replace advice given to you by your health care provider. Make sure you discuss any questions you have with your health care provider. Document Revised: 04/23/2021 Document Reviewed: 04/23/2021 Elsevier Patient Education  2024 [employer].

## 2024-03-22 NOTE — Progress Notes (Signed)
 Subjective:    Patient ID: Rhonda Kelly, female    DOB: 09/13/1945, 78 y.o.   MRN: [id number]  Chief Complaint  Patient presents with   Medical Management of Chronic Issues   Groin Pain    6 MTHS RIGHT SIDE    PT presents to the office today for chronic follow up.   She has aortic atherosclerosis and takes Lipitor daily.  She has osteoporosis and takes fosamax  weekly. Her last Dexascan was 09/01/22.  Thyroid  Problem Presents for follow-up visit. Symptoms include dry skin. Patient reports no anxiety, diaphoresis, fatigue or hoarse voice. The symptoms have been stable.  Hyperlipidemia This is a chronic problem. The current episode started more than 1 year ago. The problem is uncontrolled. Recent lipid tests were reviewed and are high. Factors aggravating her hyperlipidemia include smoking. Current antihyperlipidemic treatment includes statins. The current treatment provides moderate improvement of lipids. Risk factors for coronary artery disease include dyslipidemia, hypertension, a sedentary lifestyle and post-menopausal.  Nicotine  Dependence Presents for follow-up visit. Symptoms are negative for fatigue. The symptoms have been stable. She smokes < 1/2 a pack of cigarettes per day.  Hip Pain  The incident occurred more than 1 week ago. There was no injury mechanism. The pain is present in the right hip. The quality of the pain is described as aching. The pain is at a severity of 3/10. The pain is mild. The pain has been Intermittent since onset. Pertinent negatives include no numbness or tingling. She reports no foreign bodies present. The symptoms are aggravated by weight bearing (walking up steps). She has tried nothing for the symptoms. The treatment provided no relief.      Review of Systems  Constitutional:  Negative for diaphoresis and fatigue.  HENT:  Negative for hoarse voice.   Neurological:  Negative for tingling and numbness.  Psychiatric/Behavioral:  The patient is not  nervous/anxious.   All other systems reviewed and are negative.      Objective:   Physical Exam Vitals reviewed.  Constitutional:      General: She is not in acute distress.    Appearance: She is well-developed. She is obese.  HENT:     Head: Normocephalic and atraumatic.     Right Ear: Tympanic membrane normal.     Left Ear: Tympanic membrane normal.  Eyes:     Pupils: Pupils are equal, round, and reactive to light.  Neck:     Thyroid : No thyromegaly.  Cardiovascular:     Rate and Rhythm: Normal rate and regular rhythm.     Heart sounds: Normal heart sounds. No murmur heard. Pulmonary:     Effort: Pulmonary effort is normal. No respiratory distress.     Breath sounds: Normal breath sounds. No wheezing.  Abdominal:     General: Bowel sounds are normal. There is no distension.     Palpations: Abdomen is soft.     Tenderness: There is no abdominal tenderness.  Musculoskeletal:        General: Tenderness present.     Cervical back: Normal range of motion and neck supple.     Comments: Pain in right groin with external rotation  Skin:    General: Skin is warm and dry.  Neurological:     Mental Status: She is alert and oriented to person, place, and time.     Cranial Nerves: No cranial nerve deficit.     Deep Tendon Reflexes: Reflexes are normal and symmetric.  Psychiatric:  Behavior: Behavior normal.        Thought Content: Thought content normal.        Judgment: Judgment normal.       BP (!) 146/69   Pulse 67   Temp 97.6 F (36.4 C) (Temporal)   SpO2 97%      Assessment & Plan:  Rhonda Kelly comes in today with chief complaint of Medical Management of Chronic Issues and Groin Pain (6 MTHS RIGHT SIDE )   Diagnosis and orders addressed:  1. Osteoporosis, postmenopausal - alendronate  (FOSAMAX ) 70 MG tablet; TAKE 1 TABLET WEEKLY (TAKE WITH 8OZ OF WATER 30 MINUTES BEFORE BREAKFAST)  Dispense: 12 tablet; Refill: 4 - CMP14+EGFR  2. Aortic  atherosclerosis - atorvastatin  (LIPITOR) 20 MG tablet; Take 1 tablet (20 mg total) by mouth daily.  Dispense: 90 tablet; Refill: 3 - CMP14+EGFR  3. Hyperlipidemia, unspecified hyperlipidemia type - atorvastatin  (LIPITOR) 20 MG tablet; Take 1 tablet (20 mg total) by mouth daily.  Dispense: 90 tablet; Refill: 3 - CMP14+EGFR  4. Pulmonary emphysema, unspecified emphysema type - budesonide -formoterol  (SYMBICORT ) 160-4.5 MCG/ACT inhaler; Inhale 2 puffs into the lungs 2 (two) times daily.  Dispense: 10.2 g; Refill: 3 - CMP14+EGFR  5. Hypothyroidism, unspecified type (Primary) - levothyroxine  (SYNTHROID ) 50 MCG tablet; TAKE 1 TABLET BY MOUTH EVERY MORNING BEFORE BREAKFAST **NEEDS TO BE SEEN BEFORE NEXT REFILL**  Dispense: 90 tablet; Refill: 1 - CMP14+EGFR - TSH  6. Tobacco abuse - CMP14+EGFR  7. Right hip pain Start diclofenac  BID with food, no other NSAID's  ROM exercises  - CMP14+EGFR - DG HIP UNILAT W OR W/O PELVIS 2-3 VIEWS RIGHT; Future - diclofenac  (VOLTAREN ) 75 MG EC tablet; Take 1 tablet (75 mg total) by mouth 2 (two) times daily.  Dispense: 60 tablet; Refill: 2  8. Elevated blood pressure reading Monitor BP at home   Labs pending Patient reviewed in [place] controlled database, no flags noted. Contract and drug screen are up to date.  Health Maintenance reviewed Diet and exercise encouraged  Follow up plan: 6 months    Bari Learn, FNP

## 2024-03-23 LAB — CMP14+EGFR
ALT: 18 IU/L (ref 0–32)
AST: 26 IU/L (ref 0–40)
Albumin: 4.5 g/dL (ref 3.8–4.8)
Alkaline Phosphatase: 106 IU/L (ref 49–135)
BUN/Creatinine Ratio: 11 — ABNORMAL LOW (ref 12–28)
BUN: 8 mg/dL (ref 8–27)
Bilirubin Total: 0.3 mg/dL (ref 0.0–1.2)
CO2: 20 mmol/L (ref 20–29)
Calcium: 10.2 mg/dL (ref 8.7–10.3)
Chloride: 100 mmol/L (ref 96–106)
Creatinine, Ser: 0.73 mg/dL (ref 0.57–1.00)
Globulin, Total: 2.6 g/dL (ref 1.5–4.5)
Glucose: 89 mg/dL (ref 70–99)
Potassium: 4.5 mmol/L (ref 3.5–5.2)
Sodium: 140 mmol/L (ref 134–144)
Total Protein: 7.1 g/dL (ref 6.0–8.5)
eGFR: 85 mL/min/1.73 (ref >59)

## 2024-03-23 LAB — TSH: TSH: 1.6 u[IU]/mL (ref 0.450–4.500)

## 2024-03-24 ENCOUNTER — Ambulatory Visit: Payer: Self-pay | Admitting: Family

## 2024-05-12 DIAGNOSIS — H2513 Age-related nuclear cataract, bilateral: Secondary | ICD-10-CM | POA: Diagnosis not present

## 2024-05-12 DIAGNOSIS — H353131 Nonexudative age-related macular degeneration, bilateral, early dry stage: Secondary | ICD-10-CM | POA: Diagnosis not present

## 2024-05-20 DIAGNOSIS — H25812 Combined forms of age-related cataract, left eye: Secondary | ICD-10-CM | POA: Diagnosis not present

## 2024-05-20 DIAGNOSIS — H2512 Age-related nuclear cataract, left eye: Secondary | ICD-10-CM | POA: Diagnosis not present

## 2024-05-29 DIAGNOSIS — Z23 Encounter for immunization: Secondary | ICD-10-CM | POA: Diagnosis not present

## 2024-06-01 ENCOUNTER — Other Ambulatory Visit: Payer: Self-pay | Admitting: Family

## 2024-06-01 DIAGNOSIS — E039 Hypothyroidism, unspecified: Secondary | ICD-10-CM

## 2024-06-10 DIAGNOSIS — H2511 Age-related nuclear cataract, right eye: Secondary | ICD-10-CM | POA: Diagnosis not present

## 2024-06-10 DIAGNOSIS — H25811 Combined forms of age-related cataract, right eye: Secondary | ICD-10-CM | POA: Diagnosis not present

## 2024-07-14 ENCOUNTER — Encounter: Payer: Self-pay | Admitting: *Deleted

## 2024-07-14 ENCOUNTER — Other Ambulatory Visit: Payer: Self-pay | Admitting: *Deleted

## 2024-07-14 DIAGNOSIS — Z122 Encounter for screening for malignant neoplasm of respiratory organs: Secondary | ICD-10-CM

## 2024-07-14 DIAGNOSIS — F17210 Nicotine dependence, cigarettes, uncomplicated: Secondary | ICD-10-CM

## 2024-07-14 DIAGNOSIS — Z87891 Personal history of nicotine dependence: Secondary | ICD-10-CM

## 2024-07-20 ENCOUNTER — Ambulatory Visit: Payer: Medicare Other

## 2024-07-20 VITALS — BP 146/69 | HR 67 | Ht 61.0 in | Wt 108.0 lb

## 2024-07-20 DIAGNOSIS — Z122 Encounter for screening for malignant neoplasm of respiratory organs: Secondary | ICD-10-CM

## 2024-07-20 DIAGNOSIS — Z0000 Encounter for general adult medical examination without abnormal findings: Secondary | ICD-10-CM

## 2024-07-20 NOTE — Progress Notes (Signed)
 "  Chief Complaint  Patient presents with   Medicare Wellness     Subjective:   Rhonda Kelly is a 79 y.o. female who presents for a Medicare Annual Wellness Visit.  Visit info / Clinical Intake: Medicare Wellness Visit Type:: Subsequent Annual Wellness Visit Persons participating in visit and providing information:: patient Medicare Wellness Visit Mode:: Telephone If telephone:: video declined Since this visit was completed virtually, some vitals may be partially provided or unavailable. Missing vitals are due to the limitations of the virtual format.: Unable to obtain vitals - no equipment If Telephone or Video please confirm:: I connected with patient using audio/video enable telemedicine. I verified patient identity with two identifiers, discussed telehealth limitations, and patient agreed to proceed. Patient Location:: home Provider Location:: office Interpreter Needed?: No Pre-visit prep was completed: yes AWV questionnaire completed by patient prior to visit?: no Living arrangements:: lives with spouse/significant other Patient's Overall Health Status Rating: very good Typical amount of pain: none Does pain affect daily life?: no Are you currently prescribed opioids?: no  Dietary Habits and Nutritional Risks How many meals a day?: 2 Eats fruit and vegetables daily?: yes Most meals are obtained by: preparing own meals In the last 2 weeks, have you had any of the following?: none Diabetic:: no  Functional Status Activities of Daily Living (to include ambulation/medication): Independent Ambulation: Independent Medication Administration: Independent Home Management (perform basic housework or laundry): Independent Manage your own finances?: yes Primary transportation is: driving Concerns about vision?: no *vision screening is required for WTM* (last 2025 dr. Maree) Concerns about hearing?: no  Fall Screening Falls in the past year?: 0 Number of falls in past year: 0 Was  there an injury with Fall?: 0 Fall Risk Category Calculator: 0 Patient Fall Risk Level: Low Fall Risk  Fall Risk Patient at Risk for Falls Due to: No Fall Risks Fall risk Follow up: Falls evaluation completed; Education provided  Home and Transportation Safety: All rugs have non-skid backing?: yes All stairs or steps have railings?: yes Grab bars in the bathtub or shower?: yes Have non-skid surface in bathtub or shower?: yes Good home lighting?: yes Regular seat belt use?: yes Hospital stays in the last year:: no  Cognitive Assessment Difficulty concentrating, remembering, or making decisions? : no Will 6CIT or Mini Cog be Completed: yes What year is it?: 0 points What month is it?: 0 points Give patient an address phrase to remember (5 components): [address] [place] About what time is it?: 0 points Count backwards from 20 to 1: 0 points Say the months of the year in reverse: 0 points Repeat the address phrase from earlier: 0 points 6 CIT Score: 0 points  Advance Directives (For Healthcare) Does Patient Have a Medical Advance Directive?: No Would patient like information on creating a medical advance directive?: No - Patient declined  Reviewed/Updated  Reviewed/Updated: Reviewed All (Medical, Surgical, Family, Medications, Allergies, Care Teams, Patient Goals); Medical History; Surgical History; Family History; Medications; Allergies; Care Teams; Patient Goals    Allergies (verified) Patient has no known allergies.   Current Medications (verified) Outpatient Encounter Medications as of 07/20/2024  Medication Sig   alendronate  (FOSAMAX ) 70 MG tablet TAKE 1 TABLET WEEKLY (TAKE WITH 8OZ OF WATER 30 MINUTES BEFORE BREAKFAST)   aspirin 81 MG tablet Take 81 mg by mouth daily.   atorvastatin  (LIPITOR) 20 MG tablet Take 1 tablet (20 mg total) by mouth daily.   BLACK ELDERBERRY PO Take by mouth.   budesonide -formoterol  (  SYMBICORT ) 160-4.5 MCG/ACT inhaler Inhale 2  puffs into the lungs 2 (two) times daily.   Cholecalciferol 1000 units TBDP Take 1,000 Int'l Units by mouth daily.   Co-Enzyme Q-10 100 MG CAPS Take 100 mg by mouth daily.   diclofenac  (VOLTAREN ) 75 MG EC tablet Take 1 tablet (75 mg total) by mouth 2 (two) times daily.   KRILL OIL PO Take 1 capsule by mouth daily.   levothyroxine  (SYNTHROID ) 50 MCG tablet TAKE 1 TABLET BY MOUTH EVERY MORNING BEFORE BREAKFAST **NEEDS TO BE SEEN BEFORE NEXT REFILL**   multivitamin-lutein (OCUVITE-LUTEIN) CAPS capsule Take 1 capsule by mouth daily. icaps Areds 2   OVER THE COUNTER MEDICATION Vitamin c with zinc   RESTASIS 0.05 % ophthalmic emulsion    No facility-administered encounter medications on file as of 07/20/2024.    History: Past Medical History:  Diagnosis Date   Allergy    some seasonal- otc meds prn only   Bladder prolapse, female, acquired    surgery pending to repair 09-24-15   Cancer [facility])    skin cancer   Cataract    small   Colon polyp    Colon polyp    Diverticulosis    GERD (gastroesophageal reflux disease)    Hyperlipidemia    Macular degeneration    Osteoporosis    Thyroid  disease    25 years ago- no meds now    Past Surgical History:  Procedure Laterality Date   ABDOMINAL HYSTERECTOMY Bilateral 1993   leiomyomata, bleeding   COLONOSCOPY     FOOT SURGERY  1977   right   MOUTH SURGERY     dental implants x 2   OOPHORECTOMY     BSO   POLYPECTOMY     SQUAMOUS CELL CARCINOMA EXCISION Left 2018   SCC removed from upper left leg   TONSILLECTOMY  1972   TUBAL LIGATION     Family History  Problem Relation Age of Onset   Colon cancer Mother 43   Heart attack Mother        during gall bladder surgery   Heart disease Mother    Cancer Father        prostate   Diabetes Sister    Colon polyps Sister    Liver disease Daughter        States recent labs were normal   Colon cancer Maternal Aunt    Colon cancer Maternal Uncle    Colon cancer Maternal Grandmother 77    Stroke Brother    Heart attack Brother    CAD Brother    Heart attack Brother    CAD Brother    Diabetes Brother    Stomach cancer Neg Hx    Esophageal cancer Neg Hx    Pancreatic cancer Neg Hx    Breast cancer Neg Hx    Social History   Occupational History   Occupation: Retired    Associate Professor: [employer]    Comment: Retired in 2009  Tobacco Use   Smoking status: Every Day    Current packs/day: 0.50    Average packs/day: 0.5 packs/day for 45.0 years (22.5 ttl pk-yrs)    Types: Cigarettes   Smokeless tobacco: Never  Vaping Use   Vaping status: Never Used  Substance and Sexual Activity   Alcohol use: Never   Drug use: No   Sexual activity: Not Currently    Birth control/protection: Surgical    Comment: 1st intercourse 79 yo-Fewer than 5 partners   Tobacco Counseling Ready to quit:  Not Answered Counseling given: Yes  SDOH Screenings   Food Insecurity: No Food Insecurity (07/20/2024)  Housing: Unknown (07/20/2024)  Transportation Needs: No Transportation Needs (07/20/2024)  Utilities: Not At Risk (07/20/2024)  Alcohol Screen: Low Risk (07/20/2023)  Depression (PHQ2-9): Low Risk (07/20/2024)  Financial Resource Strain: Low Risk (07/20/2023)  Physical Activity: Sufficiently Active (07/20/2024)  Social Connections: Socially Integrated (07/20/2024)  Stress: No Stress Concern Present (07/20/2024)  Tobacco Use: High Risk (07/20/2024)  Health Literacy: Adequate Health Literacy (07/20/2024)   See flowsheets for full screening details  Depression Screen PHQ 2 & 9 Depression Scale- Over the past 2 weeks, how often have you been bothered by any of the following problems? Little interest or pleasure in doing things: 0 Feeling down, depressed, or hopeless (PHQ Adolescent also includes...irritable): 0 PHQ-2 Total Score: 0 Trouble falling or staying asleep, or sleeping too much: 0 Feeling tired or having little energy: 0 Poor appetite or overeating (PHQ Adolescent also includes...weight loss):  0 Feeling bad about yourself - or that you are a failure or have let yourself or your family down: 0 Trouble concentrating on things, such as reading the newspaper or watching television (PHQ Adolescent also includes...like school work): 0 Moving or speaking so slowly that other people could have noticed. Or the opposite - being so fidgety or restless that you have been moving around a lot more than usual: 0 Thoughts that you would be better off dead, or of hurting yourself in some way: 0 PHQ-9 Total Score: 0 If you checked off any problems, how difficult have these problems made it for you to do your work, take care of things at home, or get along with other people?: Not difficult at all     Goals Addressed             This Visit's Progress    Quit Smoking   Not on track    Use chantix  and Nicorette  to help quit smoking.               Objective:    There were no vitals filed for this visit. There is no height or weight on file to calculate BMI.  Hearing/Vision screen No results found. Immunizations and Health Maintenance Health Maintenance  Topic Date Due   COVID-19 Vaccine (7 - 2025-26 season) 02/29/2024   Lung Cancer Screening  08/11/2024   Bone Density Scan  08/31/2024   Mammogram  11/24/2024   Medicare Annual Wellness (AWV)  07/20/2025   Colonoscopy  10/18/2025   DTaP/Tdap/Td (2 - Td or Tdap) 09/19/2029   Pneumococcal Vaccine: 50+ Years  Completed   Influenza Vaccine  Completed   Hepatitis C Screening  Completed   Zoster Vaccines- Shingrix   Completed   Meningococcal B Vaccine  Aged Out        Assessment/Plan:  This is a routine wellness examination for Rhonda Kelly.  Patient Care Team: Lavell Bari LABOR, FNP as PCP - General (Family Medicine) Ladora Ross Lacy Phebe, MD as Referring Physician (Optometry) Abran Norleen SAILOR, MD as Consulting Physician (Gastroenterology) Dr. Maree as Consulting Physician (Ophthalmology) Dr. delos Richard as Consulting Physician (Dentistry)  I  have personally reviewed and noted the following in the patients chart:   Medical and social history Use of alcohol, tobacco or illicit drugs  Current medications and supplements including opioid prescriptions. Functional ability and status Nutritional status Physical activity Advanced directives List of other physicians Hospitalizations, surgeries, and ER visits in previous 12 months Vitals Screenings to include cognitive, depression,  and falls Referrals and appointments  Orders Placed This Encounter  Procedures   Ambulatory referral to Pulmonology    Referral Priority:   Routine    Referral Type:   Consultation    Referral Reason:   Specialty Services Required    Requested Specialty:   Pulmonary Disease    Number of Visits Requested:   1   In addition, I have reviewed and discussed with patient certain preventive protocols, quality metrics, and best practice recommendations. A written personalized care plan for preventive services as well as general preventive health recommendations were provided to patient.   Ozie Ned, CMA   07/20/2024   Return in 1 year (on 07/20/2025).  After Visit Summary: (MyChart) Due to this being a telephonic visit, the after visit summary with patients personalized plan was offered to patient via MyChart   Nurse Notes: HM Addressed: Referral sent for Low Dose Chest CT (smoker/hx smoking) "

## 2024-07-20 NOTE — Patient Instructions (Signed)
 Ms. Ressler,  Thank you for taking the time for your Medicare Wellness Visit. I appreciate your continued commitment to your health goals. Please review the care plan we discussed, and feel free to reach out if I can assist you further.  Please note that Annual Wellness Visits do not include a physical exam. Some assessments may be limited, especially if the visit was conducted virtually. If needed, we may recommend an in-person follow-up with your provider.  Ongoing Care Seeing your primary care provider every 3 to 6 months helps us  monitor your health and provide consistent, personalized care.   Referrals If a referral was made during today's visit and you haven't received any updates within two weeks, please contact the referred provider directly to check on the status.  Recommended Screenings:  Health Maintenance  Topic Date Due   COVID-19 Vaccine (7 - 2025-26 season) 02/29/2024   Medicare Annual Wellness Visit  07/19/2024   Screening for Lung Cancer  08/11/2024   Osteoporosis screening with Bone Density Scan  08/31/2024   Breast Cancer Screening  11/24/2024   Colon Cancer Screening  10/18/2025   DTaP/Tdap/Td vaccine (2 - Td or Tdap) 09/19/2029   Pneumococcal Vaccine for age over 6  Completed   Flu Shot  Completed   Hepatitis C Screening  Completed   Zoster (Shingles) Vaccine  Completed   Meningitis B Vaccine  Aged Out       07/20/2024   12:04 PM  Advanced Directives  Does Patient Have a Medical Advance Directive? No  Would patient like information on creating a medical advance directive? No - Patient declined    Vision: Annual vision screenings are recommended for early detection of glaucoma, cataracts, and diabetic retinopathy. These exams can also reveal signs of chronic conditions such as diabetes and high blood pressure.  Dental: Annual dental screenings help detect early signs of oral cancer, gum disease, and other conditions linked to overall health, including heart  disease and diabetes.  Please see the attached documents for additional preventive care recommendations.

## 2024-08-14 ENCOUNTER — Ambulatory Visit (HOSPITAL_COMMUNITY)

## 2024-08-31 ENCOUNTER — Ambulatory Visit: Admitting: Internal Medicine

## 2025-07-21 ENCOUNTER — Ambulatory Visit
